# Patient Record
Sex: Female | Born: 1960 | Race: White | Hispanic: No | Marital: Married | Smoking: Never smoker
Health system: Southern US, Community
[De-identification: ages and names within clinical notes are randomized; demographics above are authoritative.]

## PROBLEM LIST (undated history)

## (undated) DIAGNOSIS — T7840XA Allergy, unspecified, initial encounter: Secondary | ICD-10-CM

## (undated) DIAGNOSIS — M549 Dorsalgia, unspecified: Secondary | ICD-10-CM

## (undated) DIAGNOSIS — I10 Essential (primary) hypertension: Secondary | ICD-10-CM

## (undated) DIAGNOSIS — Z8719 Personal history of other diseases of the digestive system: Secondary | ICD-10-CM

## (undated) DIAGNOSIS — Z8619 Personal history of other infectious and parasitic diseases: Secondary | ICD-10-CM

## (undated) DIAGNOSIS — M1990 Unspecified osteoarthritis, unspecified site: Secondary | ICD-10-CM

## (undated) HISTORY — DX: Allergy, unspecified, initial encounter: T78.40XA

## (undated) HISTORY — PX: AUGMENTATION MAMMAPLASTY: SUR837

## (undated) HISTORY — DX: Personal history of other diseases of the digestive system: Z87.19

## (undated) HISTORY — DX: Personal history of other infectious and parasitic diseases: Z86.19

## (undated) HISTORY — DX: Dorsalgia, unspecified: M54.9

## (undated) HISTORY — PX: ABDOMINAL HYSTERECTOMY: SHX81

## (undated) HISTORY — DX: Unspecified osteoarthritis, unspecified site: M19.90

## (undated) HISTORY — DX: Essential (primary) hypertension: I10

---

## 2005-08-21 ENCOUNTER — Other Ambulatory Visit: Admission: RE | Admit: 2005-08-21 | Discharge: 2005-08-21 | Payer: Self-pay | Admitting: Obstetrics & Gynecology

## 2005-10-23 ENCOUNTER — Inpatient Hospital Stay (HOSPITAL_COMMUNITY): Admission: AD | Admit: 2005-10-23 | Discharge: 2005-10-25 | Payer: Self-pay | Admitting: Obstetrics & Gynecology

## 2008-02-13 HISTORY — PX: BREAST ENHANCEMENT SURGERY: SHX7

## 2010-04-20 ENCOUNTER — Other Ambulatory Visit: Payer: Self-pay | Admitting: Obstetrics & Gynecology

## 2010-04-20 DIAGNOSIS — Z1231 Encounter for screening mammogram for malignant neoplasm of breast: Secondary | ICD-10-CM

## 2010-05-03 ENCOUNTER — Ambulatory Visit
Admission: RE | Admit: 2010-05-03 | Discharge: 2010-05-03 | Disposition: A | Discharge status: Home / Self Care | Payer: BC Managed Care – PPO | Source: Ambulatory Visit | Attending: Obstetrics & Gynecology | Admitting: Obstetrics & Gynecology

## 2010-05-03 DIAGNOSIS — Z1231 Encounter for screening mammogram for malignant neoplasm of breast: Secondary | ICD-10-CM

## 2010-05-15 ENCOUNTER — Encounter (INDEPENDENT_AMBULATORY_CARE_PROVIDER_SITE_OTHER): Payer: BC Managed Care – PPO | Admitting: Vascular Surgery

## 2010-05-15 ENCOUNTER — Encounter (INDEPENDENT_AMBULATORY_CARE_PROVIDER_SITE_OTHER): Payer: BC Managed Care – PPO

## 2010-05-15 DIAGNOSIS — I83893 Varicose veins of bilateral lower extremities with other complications: Secondary | ICD-10-CM

## 2010-05-15 DIAGNOSIS — M7989 Other specified soft tissue disorders: Secondary | ICD-10-CM

## 2010-05-16 NOTE — Consult Note (Signed)
NEW PATIENT CONSULTATION  Jacqueline, Beasley H DOB:  [birth year]                                       05/15/2010 CHART#:[id number]  The patient is a healthy 50 year old female referred by Dr. Hyacinth Meeker for edema in both ankles, right worse than left.  She states that over the past 2-3 years she has been noticing some swelling in the ankles and feet, right worse than left.  She started running on a regular basis about this time period.  She has no history of trauma, deep vein thrombosis, thrombophlebitis, varicose veins, stasis ulcers, bleeding or other thrombotic type problems.  She does wear short-leg elastic compression stockings which helps with the swelling.  She denies any specific pain associated with this.  CHRONIC MEDICAL PROBLEMS:  None.  No diabetes, hypertension, hyperlipidemia, coronary artery disease, COPD or stroke.  PAST SURGICAL HISTORY:  Includes cesarean section and hysterectomy.  SOCIAL HISTORY:  She is married, has 2 children, works as an Print production planner.  Does not use tobacco.  Drinks occasional alcohol.  FAMILY HISTORY:  Positive for stroke in her mother, diabetes in a grandmother.  Negative for coronary artery disease.  REVIEW OF SYSTEMS:  Positive for weight gain, dizziness, leg discomfort with walking, occasional dyspnea on exertion, anemia prior to her hysterectomy.  All other review in complete review of systems are negative.  PHYSICAL EXAM:  Vital signs:  Blood pressure 144/89, heart rate 73, respirations 18.  General:  She is well-developed, well-nourished, middle-aged female in no apparent distress, alert and oriented x3. HEENT:  Exam normal for age.  EOMs intact.  Lungs:  Clear to auscultation.  No rhonchi or wheezing.  Cardiovascular:  Reveals regular rhythm.  No murmurs.  Carotid pulses 3+.  No audible bruits.  Abdomen: Soft, nontender with no masses.  Musculoskeletal:  Exam is free of major deformities.  Neurological:  Normal.   Skin:  Free of rashes.  Lower extremity exam reveals no appreciable edema on exam today.  She has 3+ femoral, popliteal and dorsalis pedis pulses palpable.  There is no evidence of any venous insufficiency such as hyperpigmentation, ulceration, varicosities or spider veins.  Today I ordered venous duplex exam which I reviewed and interpreted to rule out any venous problems in the deep or superficial system.  This study was normal.  No evidence of DVT or superficial thrombophlebitis with no reflux in either the deep or superficial system.  I discussed these findings with her and recommended only elevation and short-leg elastic compression stockings.  No further treatment is necessary.  There is no significant pathology identified in her venous system.    Jacqueline Beasley Rochester, M.D. Electronically Signed  JDL/MEDQ  D:  05/15/2010  T:  05/16/2010  Job:  [id number]  cc:   M. Leda Quail, MD

## 2010-05-22 NOTE — Procedures (Unsigned)
DUPLEX DEEP VENOUS EXAM - LOWER EXTREMITY  INDICATION:  Right lower extremity swelling.  HISTORY:  Edema:  Yes. Trauma/Surgery:  No. Pain:  No. PE:  No. Previous DVT:  No. Anticoagulants:  No. Other:  DUPLEX EXAM:               CFV   SFV   PopV  PTV    GSV               R  L  R  L  R  L  R   L  R  L Thrombosis    o  o  o     o     o      o Spontaneous   +  +  +     + Phasic        +  +  +     + Augmentation  +  +  +     +     +      + Compressible  +  +  +     +     +      + Competent     +  +  +     +     +      +  Legend:  + - yes  o - no  p - partial  D - decreased  IMPRESSION:  No evidence of deep venous thrombosis or superficial venous thrombophlebitis or significant reflux in the right lower extremity.   _____________________________ Quita Skye Hart Rochester, M.D.  LT/MEDQ  D:  05/15/2010  T:  05/15/2010  Job:  [id number]

## 2011-04-17 ENCOUNTER — Other Ambulatory Visit: Payer: Self-pay | Admitting: Obstetrics & Gynecology

## 2011-04-17 DIAGNOSIS — Z1231 Encounter for screening mammogram for malignant neoplasm of breast: Secondary | ICD-10-CM

## 2011-05-08 ENCOUNTER — Ambulatory Visit
Admission: RE | Admit: 2011-05-08 | Discharge: 2011-05-08 | Disposition: A | Discharge status: Home / Self Care | Payer: BC Managed Care – PPO | Source: Ambulatory Visit | Attending: Obstetrics & Gynecology | Admitting: Obstetrics & Gynecology

## 2011-05-08 DIAGNOSIS — Z1231 Encounter for screening mammogram for malignant neoplasm of breast: Secondary | ICD-10-CM

## 2012-02-04 ENCOUNTER — Encounter: Payer: Self-pay | Admitting: Internal Medicine

## 2012-02-04 ENCOUNTER — Ambulatory Visit (INDEPENDENT_AMBULATORY_CARE_PROVIDER_SITE_OTHER): Payer: BC Managed Care – PPO | Admitting: Internal Medicine

## 2012-02-04 VITALS — BP 144/90 | HR 68 | Temp 98.1°F | Ht 62.75 in | Wt 172.0 lb

## 2012-02-04 DIAGNOSIS — Z299 Encounter for prophylactic measures, unspecified: Secondary | ICD-10-CM

## 2012-02-04 DIAGNOSIS — H9209 Otalgia, unspecified ear: Secondary | ICD-10-CM

## 2012-02-04 DIAGNOSIS — Z90710 Acquired absence of both cervix and uterus: Secondary | ICD-10-CM

## 2012-02-04 DIAGNOSIS — E8940 Asymptomatic postprocedural ovarian failure: Secondary | ICD-10-CM

## 2012-02-04 DIAGNOSIS — R030 Elevated blood-pressure reading, without diagnosis of hypertension: Secondary | ICD-10-CM

## 2012-02-04 DIAGNOSIS — E8941 Symptomatic postprocedural ovarian failure: Secondary | ICD-10-CM

## 2012-02-04 LAB — LIPID PANEL
HDL: 64.9 mg/dL (ref >39.00)
VLDL: 22 mg/dL (ref 0.0–40.0)

## 2012-02-04 LAB — BASIC METABOLIC PANEL
BUN: 7 mg/dL (ref 6–23)
Chloride: 102 mEq/L (ref 96–112)
Glucose, Bld: 94 mg/dL (ref 70–99)
Potassium: 4.2 mEq/L (ref 3.5–5.1)

## 2012-02-04 LAB — CBC WITH DIFFERENTIAL/PLATELET
Basophils Absolute: 0 10*3/uL (ref 0.0–0.1)
Basophils Relative: 0.6 % (ref 0.0–3.0)
Eosinophils Absolute: 0.1 10*3/uL (ref 0.0–0.7)
Lymphocytes Relative: 28.2 % (ref 12.0–46.0)
MCHC: 33.6 g/dL (ref 30.0–36.0)
Monocytes Relative: 6.1 % (ref 3.0–12.0)
Neutrophils Relative %: 64 % (ref 43.0–77.0)
RBC: 4.21 Mil/uL (ref 3.87–5.11)
RDW: 13.3 % (ref 11.5–14.6)

## 2012-02-04 LAB — SEDIMENTATION RATE: Sed Rate: 6 mm/hr (ref 0–22)

## 2012-02-04 LAB — TSH: TSH: 1.25 u[IU]/mL (ref 0.35–5.50)

## 2012-02-04 LAB — HEPATIC FUNCTION PANEL
AST: 22 U/L (ref 0–37)
Alkaline Phosphatase: 54 U/L (ref 39–117)
Bilirubin, Direct: 0 mg/dL (ref 0.0–0.3)

## 2012-02-04 NOTE — Patient Instructions (Signed)
There are many causes of ear pain uncertain which one you have. It is possible you have eustachian tube dysfunction or sinus disease. TMJ can also cause pain but is a little atypical in your situation Your eardrum looks normal to me today. Can use Aleve as needed for now we'll arrange an ear nose and throat consultation ;someone will contact you for this.   Her blood pressure is elevated today you have a family history of high blood pressure. It is possible that this is just elevated because of the visit and recent use of decongestant and some pain.  Check blood pressure readings out of the office at least 5 times a week.  We'll plan to have you come back in one to 2 months at that time bring her monitor and  readings and we will decide if we need to do further intervention. Contact us if your readings are at or above 160  would begin medication.   Lifestyle intervention is also helpful for blood pressure control. Continue your exercise dietary intervention  DASH.     DASH Diet The DASH diet stands for "Dietary Approaches to Stop Hypertension." It is a healthy eating plan that has been shown to reduce high blood pressure (hypertension) in as little as 14 days, while also possibly providing other significant health benefits. These other health benefits include reducing the risk of breast cancer after menopause and reducing the risk of type 2 diabetes, heart disease, colon cancer, and stroke. Health benefits also include weight loss and slowing kidney failure in patients with chronic kidney disease.  DIET GUIDELINES  Limit salt (sodium). Your diet should contain less than 1500 mg of sodium daily.  Limit refined or processed carbohydrates. Your diet should include mostly whole grains. Desserts and added sugars should be used sparingly.  Include small amounts of heart-healthy fats. These types of fats include nuts, oils, and tub margarine. Limit saturated and trans fats. These fats have been shown to  be harmful in the body. CHOOSING FOODS  The following food groups are based on a 2000 calorie diet. See your Registered Dietitian for individual calorie needs. Grains and Grain Products (6 to 8 servings daily)  Eat More Often: Whole-wheat bread, brown rice, whole-grain or wheat pasta, quinoa, popcorn without added fat or salt (air popped).  Eat Less Often: White bread, white pasta, white rice, cornbread. Vegetables (4 to 5 servings daily)  Eat More Often: Fresh, frozen, and canned vegetables. Vegetables may be raw, steamed, roasted, or grilled with a minimal amount of fat.  Eat Less Often/Avoid: Creamed or fried vegetables. Vegetables in a cheese sauce. Fruit (4 to 5 servings daily)  Eat More Often: All fresh, canned (in natural juice), or frozen fruits. Dried fruits without added sugar. One hundred percent fruit juice ( cup [237 mL] daily).  Eat Less Often: Dried fruits with added sugar. Canned fruit in light or heavy syrup. [employer], Fish, and Poultry (2 servings or less daily. One serving is 3 to 4 oz [85-114 g]).  Eat More Often: Ninety percent or leaner ground beef, tenderloin, sirloin. Round cuts of beef, chicken breast, [place] breast. All fish. Grill, bake, or broil your meat. Nothing should be fried.  Eat Less Often/Avoid: Fatty cuts of meat, [place], or chicken leg, thigh, or wing. Fried cuts of meat or fish. Dairy (2 to 3 servings)  Eat More Often: Low-fat or fat-free milk, low-fat plain or light yogurt, reduced-fat or part-skim cheese.  Eat Less Often/Avoid: Milk (whole, 2%).Whole milk  yogurt. Full-fat cheeses. Nuts, Seeds, and Legumes (4 to 5 servings per week)  Eat More Often: All without added salt.  Eat Less Often/Avoid: Salted nuts and seeds, canned beans with added salt. Fats and Sweets (limited)  Eat More Often: Vegetable oils, tub margarines without trans fats, sugar-free gelatin. Mayonnaise and salad dressings.  Eat Less Often/Avoid: Coconut oils, palm  oils, butter, stick margarine, cream, half and half, cookies, candy, pie. FOR MORE INFORMATION The Dash Diet Eating Plan: www.dashdiet.org Document Released: 01/18/2011 Document Revised: 04/23/2011 Document Reviewed: 01/18/2011 [facility] Patient Information 2013 [place], [place]. How to Take Your Blood Pressure  These instructions are only for electronic home blood pressure machines. You will need:   An automatic or semi-automatic blood pressure machine.  Fresh batteries for the blood pressure machine. HOW DO I USE THESE TOOLS TO CHECK MY BLOOD PRESSURE?   There are 2 numbers that make up your blood pressure. For example: 120/80.  The first number (120 in our example) is called the "systolic pressure." It is a measure of the pressure in your blood vessels when your heart is pumping blood.  The second number (80 in our example) is called the "diastolic pressure." It is a measure of the pressure in your blood vessels when your heart is resting between beats.  Before you buy a home blood pressure machine, check the size of your arm so you can buy the right size cuff. Here is how to check the size of your arm:  Use a tape measure that shows both inches and centimeters.  Wrap the tape measure around the middle upper part of your arm. You may need someone to help you measure right.  Write down your arm measurement in both inches and centimeters.  To measure your blood pressure right, it is important to have the right size cuff.  If your arm is up to 13 inches (37 to 34 centimeters), get an adult cuff size.  If your arm is 13 to 17 inches (35 to 44 centimeters), get a large adult cuff size.  If your arm is 17 to 20 inches (45 to 52 centimeters), get an adult thigh cuff.  Try to rest or relax for at least 30 minutes before you check your blood pressure.  Do not smoke.  Do not have any drinks with caffeine, such as:  Pop.  Coffee.  Tea.  Check your blood pressure in a quiet  room.  Sit down and stretch out your arm on a table. Keep your arm at about the level of your heart. Let your arm relax. GETTING BLOOD PRESSURE READINGS  Make sure you remove any tight-fighting clothing from your arm. Wrap the cuff around your upper arm. Wrap it just above the bend, and above where you felt the pulse. You should be able to slip a finger between the cuff and your arm. If you cannot slip a finger in the cuff, it is too tight and should be removed and rewrapped.  Some units requires you to manually pump up the arm cuff.  Automatic units inflate the cuff when you press a button.  Cuff deflation is automatic in both models.  After the cuff is inflated, the unit measures your blood pressure and pulse. The readings are displayed on a monitor. Hold still and breathe normally while the cuff is inflated.  Getting a reading takes less than a minute.  Some models store readings in a memory. Some provide a printout of readings.  Get readings at different  times of the day. You should wait at least 5 minutes between readings. Take readings with you to your next doctor's visit. Document Released: 01/12/2008 Document Revised: 04/23/2011 Document Reviewed: 01/12/2008 [facility] Patient Information 2013 [place], [place].

## 2012-02-04 NOTE — Progress Notes (Signed)
Patient comes in as new patient visit . Previous care was   Chief Complaint  Patient presents with  . Establish Care    HPI:  Patient comes in as new patient visit . Previous care was  Through her gyne Dr Hyacinth Meeker but she has had a total hysterectomy and establishing with pcp as advised .  She has been battling a problem with ear pain since March of this year. She's been seen in urgent care number of times. Has been treated with 2 Z-Paks and then Augmentin Zyrtec is also given steroids at some point for swollen glands pain usually on the right side. Doesn't really feel congested but might be a bit stuffy. Is taking Flonase as recommended. Antibiotics seem to tamp down the pain but never goes away for a long time. She'll take decongestants with help but thinks it might elevate her blood pressure readings t took sudafed this weekend because of pain. She describes at this time as the right sided pain somewhat pressure perhaps some right face pain jaw pain down into the neck at times no dysphasia has a remote history of TMJ but not tender to touch.  No rash . Not getting home  BP readings  Usually ok if not on sudafed but does have a family history of hypertension mother ocass hear beating high like a panic.  Beating in throat   Hit or miss.  Is able to run and exercise without significant chest pain shortness of breath. Sleep  5 hours .  Usually. His voice had an issue sleeping no change. ocass jaw popping.   Has had braces .    When younger. ROS:  GEN/ HEENT: No fever, significant weight changes sweats headaches vision problems hearing changes, CV/ PULM; No chest pain  cough, syncope,change in exercise tolerance.wears compression stockings when up a lot  GI /GU: No adominal pain, vomiting, change in bowel habits. No blood in the stool. No significant GU symptoms. SKIN/HEME: ,no acute skin rashes suspicious lesions or bleeding. No lymphadenopathy, nodules, masses.  NEURO/ PSYCH:  No neurologic signs  such as weakness numbness. No depression anxiety. IMM/ Allergy: No unusual infections.  Allergy .   REST of 12 system review negative except as per HPI   Past Medical History  Diagnosis Date  . Hx of varicella     Family History  Problem Relation Age of Onset  . Cancer Mother     Uterus  . Stroke Mother     died 65   . Hypertension Mother   . Diabetes Maternal Grandmother     History   Social History  . Marital Status: Married    Spouse Name: N/A    Number of Children: N/A  . Years of Education: N/A   Social History Main Topics  . Smoking status: Never Smoker   . Smokeless tobacco: Not on file  . Alcohol Use: Yes  . Drug Use: Not on file  . Sexually Active: Not on file   Other Topics Concern  . Not on file   Social History Narrative   hh of 2-3  Son in college daughter ru grad not at home   2 dogs  No ets 1-2 per day  etohExercise 5 days per week. 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 estradiol (CLIMARA - DOSED IN MG/24 HR) 0.1 mg/24hr Place  1 patch onto the skin once a week.      . fluticasone (VERAMYST) 27.5 MCG/SPRAY nasal spray         EXAM:  BP 144/90  Pulse 68  Temp 98.1 F (36.7 C) (Oral)  Ht 5' 2.75" (1.594 m)  Wt 172 lb (78.019 kg)  BMI 30.71 kg/m2  SpO2 99%  Body mass index is 30.71 kg/(m^2). Bp right  150/92 left 144/94  GENERAL: vitals reviewed and listed above, alert, oriented, appears well hydrated and in no acute distress HEENT: Normocephalic ;atraumatic , Eyes;  PERRL, EOMs  Full, lids and conjunctiva clear,,Ears: no deformities, canals nl, TM landmarks normal, Nose: no deformity or discharge  Mild congestion > tender right maxilla  Area of right jaw line but no masses or adenopathy  Mouth : OP clear without lesion or edema . NECK: no obvious masses on inspection palpation   No tmj pain or click  LUNGS: clear to auscultation bilaterally, no wheezes, rales or rhonchi, good air movement CV: HRRR, no clubbing cyanosis or  peripheral edema nl cap refill  Abdomen:  Sof,t normal bowel sounds without hepatosplenomegaly, no guarding rebound or masses no CVA tenderness MS: moves all extremities without noticeable focal  abnormality PSYCH: pleasant and cooperative, no obvious depression or anxiety  ASSESSMENT AND PLAN:  Discussed the following assessment and plan:  1. Otalgia  HM MAMMOGRAPHY, HM PAP SMEAR, fluticasone (VERAMYST) 27.5 MCG/SPRAY nasal spray, estradiol (CLIMARA - DOSED IN MG/24 HR) 0.1 mg/24hr, Basic metabolic panel, CBC with Differential, Hepatic function panel, Lipid panel, TSH, Sedimentation rate, Ambulatory referral to ENT   uncertain cause  hx of rx with antibiotic and steroids ? referred sinus vs jaw? eustachian tube?  2. Elevated blood pressure reading  HM MAMMOGRAPHY, HM PAP SMEAR, fluticasone (VERAMYST) 27.5 MCG/SPRAY nasal spray, estradiol (CLIMARA - DOSED IN MG/24 HR) 0.1 mg/24hr, Basic metabolic panel, CBC with Differential, Hepatic function panel, Lipid panel, TSH, Sedimentation rate   fam hx of ht and mom with cva at 68  close follow up and rx as  indicated   3. Preventive measure  HM MAMMOGRAPHY, HM PAP SMEAR, fluticasone (VERAMYST) 27.5 MCG/SPRAY nasal spray, estradiol (CLIMARA - DOSED IN MG/24 HR) 0.1 mg/24hr, Basic metabolic panel, CBC with Differential, Hepatic function panel, Lipid panel, TSH, Sedimentation rate  4. 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  Call for refill climara when due.  Declined flu vaccine ;last td 2007  mammo 2013  -Patient advised to return or notify health care team  immediately if symptoms worsen or persist or new concerns arise.  Patient Instructions  There are many causes of ear pain uncertain which one you have. It is possible you have eustachian tube  dysfunction or sinus disease. TMJ can also cause pain but is a little atypical in your situation Your eardrum looks normal to me today. Can use Aleve as needed for now we'll arrange an ear nose and throat consultation ;someone will contact you for this.   Her blood pressure is elevated today you have a family history of high blood pressure. It is possible that this is just elevated because of the visit and recent use of decongestant and some pain.  Check blood pressure readings out of the office at least 5 times a week.  We'll plan to have you come back in one to 2 months at that time bring her monitor and  readings and  we will decide if we need to do further intervention. Contact us if your readings are at or above 160  would begin medication.   Lifestyle intervention is also helpful for blood pressure control. Continue your exercise dietary intervention  DASH.     DASH Diet The DASH diet stands for "Dietary Approaches to Stop Hypertension." It is a healthy eating plan that has been shown to reduce high blood pressure (hypertension) in as little as 14 days, while also possibly providing other significant health benefits. These other health benefits include reducing the risk of breast cancer after menopause and reducing the risk of type 2 diabetes, heart disease, colon cancer, and stroke. Health benefits also include weight loss and slowing kidney failure in patients with chronic kidney disease.  DIET GUIDELINES  Limit salt (sodium). Your diet should contain less than 1500 mg of sodium daily.  Limit refined or processed carbohydrates. Your diet should include mostly whole grains. Desserts and added sugars should be used sparingly.  Include small amounts of heart-healthy fats. These types of fats include nuts, oils, and tub margarine. Limit saturated and trans fats. These fats have been shown to be harmful in the body. CHOOSING FOODS  The following food groups are based on a 2000 calorie diet.  See your Registered Dietitian for individual calorie needs. Grains and Grain Products (6 to 8 servings daily)  Eat More Often: Whole-wheat bread, brown rice, whole-grain or wheat pasta, quinoa, popcorn without added fat or salt (air popped).  Eat Less Often: White bread, white pasta, white rice, cornbread. Vegetables (4 to 5 servings daily)  Eat More Often: Fresh, frozen, and canned vegetables. Vegetables may be raw, steamed, roasted, or grilled with a minimal amount of fat.  Eat Less Often/Avoid: Creamed or fried vegetables. Vegetables in a cheese sauce. Fruit (4 to 5 servings daily)  Eat More Often: All fresh, canned (in natural juice), or frozen fruits. Dried fruits without added sugar. One hundred percent fruit juice ( cup [237 mL] daily).  Eat Less Often: Dried fruits with added sugar. Canned fruit in light or heavy syrup. [employer], Fish, and Poultry (2 servings or less daily. One serving is 3 to 4 oz [85-114 g]).  Eat More Often: Ninety percent or leaner ground beef, tenderloin, sirloin. Round cuts of beef, chicken breast, [place] breast. All fish. Grill, bake, or broil your meat. Nothing should be fried.  Eat Less Often/Avoid: Fatty cuts of meat, [place], or chicken leg, thigh, or wing. Fried cuts of meat or fish. Dairy (2 to 3 servings)  Eat More Often: Low-fat or fat-free milk, low-fat plain or light yogurt, reduced-fat or part-skim cheese.  Eat Less Often/Avoid: Milk (whole, 2%).Whole milk yogurt. Full-fat cheeses. Nuts, Seeds, and Legumes (4 to 5 servings per week)  Eat More Often: All without added salt.  Eat Less Often/Avoid: Salted nuts and seeds, canned beans with added salt. Fats and Sweets (limited)  Eat More Often: Vegetable oils, tub margarines without trans fats, sugar-free gelatin. Mayonnaise and salad dressings.  Eat Less Often/Avoid: Coconut oils, palm oils, butter, stick margarine, cream, half and half, cookies, candy, pie. FOR MORE INFORMATION The Dash  Diet Eating Plan: www.dashdiet.org Document Released: 01/18/2011 Document Revised: 04/23/2011 Document Reviewed: 01/18/2011 [facility] Patient Information 2013 [place], [place]. How to Take Your Blood Pressure  These instructions are only for electronic home blood pressure machines. You will need:   An automatic or semi-automatic blood pressure machine.  Fresh batteries for the blood pressure machine. HOW DO I USE  THESE TOOLS TO CHECK MY BLOOD PRESSURE?   There are 2 numbers that make up your blood pressure. For example: 120/80.  The first number (120 in our example) is called the "systolic pressure." It is a measure of the pressure in your blood vessels when your heart is pumping blood.  The second number (80 in our example) is called the "diastolic pressure." It is a measure of the pressure in your blood vessels when your heart is resting between beats.  Before you buy a home blood pressure machine, check the size of your arm so you can buy the right size cuff. Here is how to check the size of your arm:  Use a tape measure that shows both inches and centimeters.  Wrap the tape measure around the middle upper part of your arm. You may need someone to help you measure right.  Write down your arm measurement in both inches and centimeters.  To measure your blood pressure right, it is important to have the right size cuff.  If your arm is up to 13 inches (37 to 34 centimeters), get an adult cuff size.  If your arm is 13 to 17 inches (35 to 44 centimeters), get a large adult cuff size.  If your arm is 17 to 20 inches (45 to 52 centimeters), get an adult thigh cuff.  Try to rest or relax for at least 30 minutes before you check your blood pressure.  Do not smoke.  Do not have any drinks with caffeine, such as:  Pop.  Coffee.  Tea.  Check your blood pressure in a quiet room.  Sit down and stretch out your arm on a table. Keep your arm at about the level of your heart. Let your  arm relax. GETTING BLOOD PRESSURE READINGS  Make sure you remove any tight-fighting clothing from your arm. Wrap the cuff around your upper arm. Wrap it just above the bend, and above where you felt the pulse. You should be able to slip a finger between the cuff and your arm. If you cannot slip a finger in the cuff, it is too tight and should be removed and rewrapped.  Some units requires you to manually pump up the arm cuff.  Automatic units inflate the cuff when you press a button.  Cuff deflation is automatic in both models.  After the cuff is inflated, the unit measures your blood pressure and pulse. The readings are displayed on a monitor. Hold still and breathe normally while the cuff is inflated.  Getting a reading takes less than a minute.  Some models store readings in a memory. Some provide a printout of readings.  Get readings at different times of the day. You should wait at least 5 minutes between readings. Take readings with you to your next doctor's visit. Document Released: 01/12/2008 Document Revised: 04/23/2011 Document Reviewed: 01/12/2008 [facility] Patient Information 2013 [place], [place].      Neta Mends. Kamden Stanislaw M.D.

## 2012-04-30 ENCOUNTER — Other Ambulatory Visit: Payer: Self-pay

## 2012-04-30 DIAGNOSIS — Z1231 Encounter for screening mammogram for malignant neoplasm of breast: Secondary | ICD-10-CM

## 2012-05-29 ENCOUNTER — Ambulatory Visit: Payer: Self-pay | Admitting: Obstetrics & Gynecology

## 2012-06-05 ENCOUNTER — Ambulatory Visit
Admission: RE | Admit: 2012-06-05 | Discharge: 2012-06-05 | Disposition: A | Discharge status: Home / Self Care | Payer: BC Managed Care – PPO | Source: Ambulatory Visit

## 2012-06-05 DIAGNOSIS — Z1231 Encounter for screening mammogram for malignant neoplasm of breast: Secondary | ICD-10-CM

## 2012-06-11 ENCOUNTER — Other Ambulatory Visit: Payer: Self-pay | Admitting: Internal Medicine

## 2012-06-23 ENCOUNTER — Encounter: Payer: BC Managed Care – PPO | Admitting: Internal Medicine

## 2012-06-23 ENCOUNTER — Encounter: Payer: Self-pay | Admitting: Internal Medicine

## 2012-06-23 DIAGNOSIS — Z0289 Encounter for other administrative examinations: Secondary | ICD-10-CM

## 2012-06-23 NOTE — Progress Notes (Signed)
Record opened for appt review  Pt didn't come

## 2012-07-08 ENCOUNTER — Other Ambulatory Visit: Payer: Self-pay | Admitting: Internal Medicine

## 2012-07-09 ENCOUNTER — Other Ambulatory Visit: Payer: Self-pay

## 2012-08-21 ENCOUNTER — Telehealth: Payer: Self-pay | Admitting: Internal Medicine

## 2012-08-21 ENCOUNTER — Encounter: Payer: Self-pay | Admitting: Family Medicine

## 2012-08-21 ENCOUNTER — Ambulatory Visit (INDEPENDENT_AMBULATORY_CARE_PROVIDER_SITE_OTHER): Payer: BC Managed Care – PPO | Admitting: Family Medicine

## 2012-08-21 ENCOUNTER — Ambulatory Visit (INDEPENDENT_AMBULATORY_CARE_PROVIDER_SITE_OTHER)
Admission: RE | Admit: 2012-08-21 | Discharge: 2012-08-21 | Disposition: A | Discharge status: Home / Self Care | Payer: BC Managed Care – PPO | Source: Ambulatory Visit | Attending: Family Medicine | Admitting: Family Medicine

## 2012-08-21 VITALS — BP 136/80 | HR 74 | Temp 97.9°F | Ht 62.0 in | Wt 167.0 lb

## 2012-08-21 DIAGNOSIS — M542 Cervicalgia: Secondary | ICD-10-CM

## 2012-08-21 DIAGNOSIS — M546 Pain in thoracic spine: Secondary | ICD-10-CM

## 2012-08-21 NOTE — Progress Notes (Addendum)
  Subjective:    Patient ID: Jacqueline Beasley, female    DOB: January 16, 1961, 52 y.o.   MRN: [id number]  HPI Patient seen with multiple musculoskeletal complaints. She relates about 3-4 month history of right mid thoracic back pain. She is seeing chiropractor and reportedly has had previous thoracic x-rays and we do not have those results. She describes a sharp to dull occasional burning pain which radiates anterior right side. No abdominal pain. No rash. Has taken Aleve without relief. No alleviating factors. Denies any dyspnea, cough, fever, chills, pleuritic pain, or any appetite or weight changes. No improvement with chiropractic care for several months now. Symptoms are progressing in severity.  Patient also relates right cervical neck pain for over one year Those symptoms are also progressive. Occasional sharp pains right upper extremity. No significant numbness or weakness. No alleviating factors. Previous lab work unremarkable including recent sedimentation rate of 6. Denies history of any recent injury  Past Medical History  Diagnosis Date  . Hx of varicella    Past Surgical History  Procedure Laterality Date  . Abdominal hysterectomy      total with bso  for fibroids.   . Cesarean section  [id number]  . Breast enhancement surgery  2010    reports that she has never smoked. She does not have any smokeless tobacco history on file. She reports that  drinks alcohol. Her drug history is not on file. family history includes Cancer in her mother; Diabetes in her maternal grandmother; Hypertension in her mother; and Stroke in her mother. No Known Allergies    Review of Systems  Constitutional: Negative for fever, chills, appetite change and unexpected weight change.  HENT: Positive for neck pain.   Respiratory: Negative for cough and shortness of breath.   Cardiovascular: Negative for chest pain and leg swelling.  Gastrointestinal: Negative for nausea, vomiting and abdominal pain.   Genitourinary: Negative for dysuria.  Musculoskeletal: Positive for back pain.  Skin: Negative for rash.  Neurological: Negative for dizziness.  Hematological: Negative for adenopathy.       Objective:   Physical Exam  Constitutional: She appears well-developed and well-nourished.  Neck: Neck supple. No thyromegaly present.  Cardiovascular: Normal rate and regular rhythm.   Pulmonary/Chest: Effort normal and breath sounds normal. No respiratory distress. She has no wheezes. She has no rales.  Musculoskeletal: She exhibits no edema.  No reproducible thoracic tenderness She has pain with flexion and extension of neck but no spinal tenderness.  Neurological:  Full-strength upper extremities. Symmetric upper extremity reflexes.  Skin: No rash noted.          Assessment & Plan:  #1 right-sided thoracic back pain of several months duration not responding to conservative chiropractic treatment. Obtain plain films thoracic spine. Question neuropathic pain. Consider MRI or further assess #2 chronic right cervical neck pain with probable right-sided radiculopathy symptoms. Plain film cervical spine. May need cervical MRI to further assess.  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  Her symptoms have been progressive for over one year and not relieved with conservative therapy.  Will set up Cervical MRI to further assess. Pt notified and agrees with plan.  Cervical MRI reveals arthritic changes especially C5-C6 and C6-C7 with some foraminal encroachment on the right which corresponds with her side of involvement. She's had previous chiropractic care without much improvement. Set up neurosurgical referral and she agrees with this plan

## 2012-08-21 NOTE — Telephone Encounter (Signed)
Patient Information:  Caller Name: Salvador  Phone: [phone]  Patient: Jacqueline, Beasley  Gender: Female  DOB: 20-Apr-1960  Age: 52 Years  PCP: Berniece Andreas [facility])  Pregnant: No  Office Follow Up:  Does the office need to follow up with this patient?: No  Instructions For The Office: N/A   Symptoms  Reason For Call & Symptoms: Reports neck and ear pain, center of back. Has seen a doctor at [facility]. Reports that she had two falls onto her right side back in January 2014.  Reviewed Health History In EMR: Yes  Reviewed Medications In EMR: Yes  Reviewed Allergies In EMR: Yes  Reviewed Surgeries / Procedures: Yes  Date of Onset of Symptoms: 08/07/2012  Treatments Tried: Aleve  Treatments Tried Worked: Yes OB / GYN:  LMP: Unknown  Guideline(s) Used:  Back Pain  Neck Pain or Stiffness  Disposition Per Guideline:   See Today in Office  Reason For Disposition Reached:   Patient wants to be seen  Advice Given:  N/A  Patient Will Follow Care Advice:  YES  Appointment Scheduled:  08/21/2012 16:30:00 Appointment Scheduled Provider:  Evelena Peat (Family Practice)

## 2012-08-22 ENCOUNTER — Telehealth: Payer: Self-pay

## 2012-08-22 NOTE — Telephone Encounter (Signed)
Pt called and left message, stating that she missed your call last night or this morning about her xray results. Pt stated that you can call her at work [phone] or on her cell [phone]

## 2012-08-26 NOTE — Telephone Encounter (Signed)
Pt states she is supposed to have an MRI set up. If we call today or tomorrow,  pls call work @ [phone].  After that, pt should have her cell back.

## 2012-08-26 NOTE — Addendum Note (Signed)
Addended by: Kristian Covey on: 08/26/2012 04:03 PM   Modules accepted: Orders

## 2012-08-27 NOTE — Telephone Encounter (Signed)
Please see note regarding phone number

## 2012-09-02 ENCOUNTER — Telehealth: Payer: Self-pay | Admitting: Family Medicine

## 2012-09-02 ENCOUNTER — Ambulatory Visit
Admission: RE | Admit: 2012-09-02 | Discharge: 2012-09-02 | Disposition: A | Discharge status: Home / Self Care | Payer: BC Managed Care – PPO | Source: Ambulatory Visit | Attending: Family Medicine | Admitting: Family Medicine

## 2012-09-02 ENCOUNTER — Other Ambulatory Visit: Payer: Self-pay | Admitting: Internal Medicine

## 2012-09-02 DIAGNOSIS — M542 Cervicalgia: Secondary | ICD-10-CM

## 2012-09-02 NOTE — Telephone Encounter (Signed)
This patient needs a preventive visit in Nov per [facility].  Please call the patient and make this appointment.  Climara patch filled until Nov.  Thanks!!

## 2012-09-04 NOTE — Addendum Note (Signed)
Addended by: Kristian Covey on: 09/04/2012 08:29 AM   Modules accepted: Orders

## 2012-12-23 ENCOUNTER — Other Ambulatory Visit (INDEPENDENT_AMBULATORY_CARE_PROVIDER_SITE_OTHER): Payer: BC Managed Care – PPO

## 2012-12-23 DIAGNOSIS — Z0000 Encounter for general adult medical examination without abnormal findings: Secondary | ICD-10-CM

## 2012-12-23 LAB — TSH: TSH: 2.39 u[IU]/mL (ref 0.35–5.50)

## 2012-12-23 LAB — HEPATIC FUNCTION PANEL
Alkaline Phosphatase: 53 U/L (ref 39–117)
Bilirubin, Direct: 0 mg/dL (ref 0.0–0.3)
Total Bilirubin: 0.7 mg/dL (ref 0.3–1.2)
Total Protein: 7 g/dL (ref 6.0–8.3)

## 2012-12-23 LAB — BASIC METABOLIC PANEL
BUN: 10 mg/dL (ref 6–23)
CO2: 27 mEq/L (ref 19–32)
Chloride: 101 mEq/L (ref 96–112)
Creatinine, Ser: 0.6 mg/dL (ref 0.4–1.2)
Glucose, Bld: 109 mg/dL — ABNORMAL HIGH (ref 70–99)
Potassium: 4.7 mEq/L (ref 3.5–5.1)

## 2012-12-23 LAB — CBC WITH DIFFERENTIAL/PLATELET
Basophils Relative: 1 % (ref 0.0–3.0)
Eosinophils Absolute: 0.1 10*3/uL (ref 0.0–0.7)
Eosinophils Relative: 2.4 % (ref 0.0–5.0)
Hemoglobin: 13.5 g/dL (ref 12.0–15.0)
Lymphocytes Relative: 37.6 % (ref 12.0–46.0)
MCHC: 34.3 g/dL (ref 30.0–36.0)
Neutro Abs: 2.5 10*3/uL (ref 1.4–7.7)
Neutrophils Relative %: 51.6 % (ref 43.0–77.0)
RBC: 4.46 Mil/uL (ref 3.87–5.11)
WBC: 4.9 10*3/uL (ref 4.5–10.5)

## 2012-12-23 LAB — LIPID PANEL
Cholesterol: 246 mg/dL — ABNORMAL HIGH (ref 0–200)
Total CHOL/HDL Ratio: 4
VLDL: 29 mg/dL (ref 0.0–40.0)

## 2012-12-30 ENCOUNTER — Ambulatory Visit (INDEPENDENT_AMBULATORY_CARE_PROVIDER_SITE_OTHER): Payer: BC Managed Care – PPO | Admitting: Internal Medicine

## 2012-12-30 ENCOUNTER — Encounter: Payer: Self-pay | Admitting: Internal Medicine

## 2012-12-30 VITALS — BP 150/94 | HR 68 | Temp 98.0°F | Ht 62.5 in | Wt 170.0 lb

## 2012-12-30 DIAGNOSIS — Z0000 Encounter for general adult medical examination without abnormal findings: Secondary | ICD-10-CM

## 2012-12-30 DIAGNOSIS — Z833 Family history of diabetes mellitus: Secondary | ICD-10-CM | POA: Insufficient documentation

## 2012-12-30 DIAGNOSIS — I10 Essential (primary) hypertension: Secondary | ICD-10-CM

## 2012-12-30 DIAGNOSIS — R7301 Impaired fasting glucose: Secondary | ICD-10-CM

## 2012-12-30 DIAGNOSIS — E785 Hyperlipidemia, unspecified: Secondary | ICD-10-CM

## 2012-12-30 DIAGNOSIS — Z1211 Encounter for screening for malignant neoplasm of colon: Secondary | ICD-10-CM

## 2012-12-30 MED ORDER — LISINOPRIL 10 MG PO TABS: 10.0000 mg | ORAL_TABLET | Freq: Every day | ORAL | Status: DC

## 2012-12-30 NOTE — Progress Notes (Signed)
Chief Complaint  Patient presents with  . Annual Exam    HPI: Patient comes in today for Preventive Health Care visit  Since her last visit she hasn't been as conscientious in regard to exercise and diet her back problem has limited her [place]. She's gained back some of the weight that she had lost. Otherwise feels pretty healthy. She states her blood pressure had been pretty good when she had lost the weight.  There is a family history of diabetes negative for colon cancer first degree relative. ROS:  GEN/ HEENT: No fever, significant weight changes sweats headaches vision problems hearing changes, CV/ PULM; No chest pain shortness of breath cough, syncope,edema  change in exercise tolerance. GI /GU: No adominal pain, vomiting, change in bowel habits. No blood in the stool. Has had a history of fissures but no bleeding. No significant GU symptoms. SKIN/HEME: ,no acute skin rashes suspicious lesions or bleeding. No lymphadenopathy, nodules, masses.  NEURO/ PSYCH:  No neurologic signs such as weakness numbness. No depression anxiety. IMM/ Allergy: No unusual infections.  Allergy .   REST of 12 system review negative except as per HPI   Past Medical History  Diagnosis Date  . Hx of varicella   . Back pain     Recurrent has seen neurosurgery in past    Family History  Problem Relation Age of Onset  . Cancer Mother     Uterus  . Stroke Mother     died 29   . Hypertension Mother   . Diabetes Maternal Grandmother    Past Surgical History  Procedure Laterality Date  . Abdominal hysterectomy      total with bso  for fibroids.   . Cesarean section  [id number]  . Breast enhancement surgery  2010     History   Social History  . Marital Status: Married    Spouse Name: N/A    Number of Children: N/A  . Years of Education: N/A   Social History Main Topics  . Smoking status: Never Smoker   . Smokeless tobacco: None  . Alcohol Use: Yes  . Drug Use: None  . Sexual Activity:  None   Other Topics Concern  . None   Social History Narrative   hh of 2-3  Son in college daughter ru grad not at home      2 dogs  No ets    1-2 per day  etoh   Exercise 5 days per week.    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 Cetirizine HCl (ZYRTEC ALLERGY) 10 MG CAPS Take 10 mg by mouth as needed.  Marland Kitchen CLIMARA 0.1 MG/24HR Place 1 patch (0.1 mg total) onto the skin once a week.  . diphenhydrAMINE (BENADRYL) 25 MG tablet Take 25 mg by mouth every 6 (six) hours as needed.  Marland Kitchen lisinopril (PRINIVIL,ZESTRIL) 10 MG tablet Take 1 tablet (10 mg total) by mouth daily.  . [DISCONTINUED] fluticasone (VERAMYST) 27.5 MCG/SPRAY nasal spray     EXAM:  BP 150/94  Pulse 68  Temp(Src) 98 F (36.7 C) (Oral)  Ht 5' 2.5" (1.588 m)  Wt 170 lb (77.111 kg)  BMI 30.58 kg/m2  SpO2 98%  Body mass index is 30.58 kg/(m^2). Repeat blood pressures 146/94 left 148/96 right. Physical Exam: Vital signs reviewed [id number] is a  well-developed well-nourished alert cooperative   female who appears her stated age in no acute distress.  HEENT: normocephalic atraumatic , Eyes: PERRL EOM's full, conjunctiva clear, Nares: paten,t no deformity discharge or tenderness., Ears: no deformity EAC's clear TMs with normal landmarks. Mouth: clear OP, no lesions, edema.  Moist mucous membranes. Dentition in adequate repair. NECK: supple without masses, thyromegaly or bruits. CHEST/PULM:  Clear to auscultation and percussion breath sounds equal no wheeze , rales or rhonchi. No chest wall deformities or tenderness. Breast: normal by inspection . No dimpling, discharge, masses, tenderness or discharge . Implants in place CV: PMI is nondisplaced, S1 S2 no gallops, murmurs, rubs. Peripheral pulses are full without delay.No JVD .  ABDOMEN: Bowel sounds normal nontender   No guard or rebound, no hepato splenomegal no CVA tenderness.  No hernia. Extremtities:  No clubbing cyanosis or edema, no acute joint swelling or redness no focal atrophy NEURO:  Oriented x3, cranial nerves 3-12 appear to be intact, no obvious focal weakness,gait within normal limits no abnormal reflexes or asymmetrical SKIN: No acute rashes normal turgor, color, no bruising or petechiae. PSYCH: Oriented, good eye contact, no obvious depression anxiety, cognition and judgment appear normal. LN: no cervical axillary inguinal adenopathy  Lab Results  Component Value Date   WBC 4.9 12/23/2012   HGB 13.5 12/23/2012   HCT 39.2 12/23/2012   PLT 323.0 12/23/2012   GLUCOSE 109* 12/23/2012   CHOL 246* 12/23/2012   TRIG 145.0 12/23/2012   HDL 67.00 12/23/2012   LDLDIRECT 161.2 12/23/2012   ALT 30 12/23/2012   AST 23 12/23/2012   NA 136 12/23/2012   K 4.7 12/23/2012   CL 101 12/23/2012   CREATININE 0.6 12/23/2012   BUN 10 12/23/2012   CO2 27 12/23/2012   TSH 2.39 12/23/2012    ASSESSMENT AND PLAN:  Discussed the following assessment and plan:  Routine general medical examination at a health care facility - Declined flu vaccine at this time. If her for colonoscopy screening - Plan: EKG 12-Lead  Special screening for malignant neoplasms, colon - Plan: Ambulatory referral to Gastroenterology  Unspecified essential hypertension - Blood pressure readings still open on the high side in the 140s 150s family history of stroke in mom. Begin low-dose medicine followup  Other and unspecified hyperlipidemia - Counseling importance of lifestyle intervention with family history patient is what to do  Family history of diabetes mellitus  Fasting hyperglycemia Expectant management discussed medication benefit versus risk followup in 2-3 months with her blood pressure machine Implement lifestyle changes to avoid getting diabetes vascular disease. Patient Care Team: Madelin Headings, MD as PCP -  General (Internal Medicine) Reinaldo Meeker, MD as Consulting Physician (Neurosurgery) Patient Instructions  Intensify lifestyle interventions. Add bp medication ROV in about 2-3 months with BP monitor  To check may do labs at that point.   Neta Mends. Anay Rathe M.D. Health Maintenance  Topic Date Due  . Colonoscopy  05/28/2012  . Influenza Vaccine  10/13/2013  . Mammogram  06/06/2014  . Tetanus/tdap  02/14/2015   Health Maintenance Review }

## 2012-12-30 NOTE — Patient Instructions (Signed)
Intensify lifestyle interventions. Add bp medication ROV in about 2-3 months with BP monitor  To check may do labs at that point.

## 2013-01-21 ENCOUNTER — Encounter: Payer: Self-pay | Admitting: Internal Medicine

## 2013-01-22 ENCOUNTER — Other Ambulatory Visit: Payer: Self-pay | Admitting: Internal Medicine

## 2013-02-18 ENCOUNTER — Ambulatory Visit (INDEPENDENT_AMBULATORY_CARE_PROVIDER_SITE_OTHER): Payer: No Typology Code available for payment source | Admitting: Internal Medicine

## 2013-02-18 ENCOUNTER — Encounter: Payer: Self-pay | Admitting: Internal Medicine

## 2013-02-18 VITALS — BP 130/92 | HR 86 | Temp 98.0°F | Wt 171.0 lb

## 2013-02-18 DIAGNOSIS — J019 Acute sinusitis, unspecified: Secondary | ICD-10-CM

## 2013-02-18 DIAGNOSIS — J069 Acute upper respiratory infection, unspecified: Secondary | ICD-10-CM

## 2013-02-18 DIAGNOSIS — I10 Essential (primary) hypertension: Secondary | ICD-10-CM

## 2013-02-18 MED ORDER — AMOXICILLIN-POT CLAVULANATE 875-125 MG PO TABS: 1.0000 | ORAL_TABLET | Freq: Two times a day (BID) | ORAL | Status: DC

## 2013-02-18 NOTE — Patient Instructions (Signed)
You have a secondary sinus infection that at this time would be best treated with antibiotics. Your blood pressure is slightly up probably from the decongestants but at this time can continue Sudafed in the day saline nasal washes and monitor your blood pressure. If blood pressure is 160/100 and above then stop the decongestants. Expect significant improvement in 3-5 days contact us if not happening Sinusitis Sinusitis is redness, soreness, and swelling (inflammation) of the paranasal sinuses. Paranasal sinuses are air pockets within the bones of your face (beneath the eyes, the middle of the forehead, or above the eyes). In healthy paranasal sinuses, mucus is able to drain out, and air is able to circulate through them by way of your nose. However, when your paranasal sinuses are inflamed, mucus and air can become trapped. This can allow bacteria and other germs to grow and cause infection. Sinusitis can develop quickly and last only a short time (acute) or continue over a long period (chronic). Sinusitis that lasts for more than 12 weeks is considered chronic.  CAUSES  Causes of sinusitis include:  Allergies.  Structural abnormalities, such as displacement of the cartilage that separates your nostrils (deviated septum), which can decrease the air flow through your nose and sinuses and affect sinus drainage.  Functional abnormalities, such as when the small hairs (cilia) that line your sinuses and help remove mucus do not work properly or are not present. SYMPTOMS  Symptoms of acute and chronic sinusitis are the same. The primary symptoms are pain and pressure around the affected sinuses. Other symptoms include:  Upper toothache.  Earache.  Headache.  Bad breath.  Decreased sense of smell and taste.  A cough, which worsens when you are lying flat.  Fatigue.  Fever.  Thick drainage from your nose, which often is green and may contain pus (purulent).  Swelling and warmth over the  affected sinuses. DIAGNOSIS  Your caregiver will perform a physical exam. During the exam, your caregiver may:  Look in your nose for signs of abnormal growths in your nostrils (nasal polyps).  Tap over the affected sinus to check for signs of infection.  View the inside of your sinuses (endoscopy) with a special imaging device with a light attached (endoscope), which is inserted into your sinuses. If your caregiver suspects that you have chronic sinusitis, one or more of the following tests may be recommended:  Allergy tests.  Nasal culture A sample of mucus is taken from your nose and sent to a lab and screened for bacteria.  Nasal cytology A sample of mucus is taken from your nose and examined by your caregiver to determine if your sinusitis is related to an allergy. TREATMENT  Most cases of acute sinusitis are related to a viral infection and will resolve on their own within 10 days. Sometimes medicines are prescribed to help relieve symptoms (pain medicine, decongestants, nasal steroid sprays, or saline sprays).  However, for sinusitis related to a bacterial infection, your caregiver will prescribe antibiotic medicines. These are medicines that will help kill the bacteria causing the infection.  Rarely, sinusitis is caused by a fungal infection. In theses cases, your caregiver will prescribe antifungal medicine. For some cases of chronic sinusitis, surgery is needed. Generally, these are cases in which sinusitis recurs more than 3 times per year, despite other treatments. HOME CARE INSTRUCTIONS   Drink plenty of water. Water helps thin the mucus so your sinuses can drain more easily.  Use a humidifier.  Inhale steam 3 to 4  times a day (for example, sit in the bathroom with the shower running).  Apply a warm, moist washcloth to your face 3 to 4 times a day, or as directed by your caregiver.  Use saline nasal sprays to help moisten and clean your sinuses.  Take over-the-counter or  prescription medicines for pain, discomfort, or fever only as directed by your caregiver. SEEK IMMEDIATE MEDICAL CARE IF:  You have increasing pain or severe headaches.  You have nausea, vomiting, or drowsiness.  You have swelling around your face.  You have vision problems.  You have a stiff neck.  You have difficulty breathing. MAKE SURE YOU:   Understand these instructions.  Will watch your condition.  Will get help right away if you are not doing well or get worse. Document Released: 01/29/2005 Document Revised: 04/23/2011 Document Reviewed: 02/13/2011 [facility] Patient Information 2014 [place], [place].

## 2013-02-18 NOTE — Progress Notes (Signed)
Chief Complaint  Patient presents with  . Nausea    Cough started a month ago.  Other sx started around Christmas.  Has taken OTC medication.  Low grade fevers only  . Dizziness  . Nasal Congestion  . Sinus Pressure  . Cough  . Fever    HPI: Patient comes in today for SDA for  new problem evaluation. See above sx off and on like cold since early December then worse x mas  Vomited  New years.gi mporved but now  Feels like sinus infection .   Now nausea all the time dizziness and has and pain and drainage.  righ face pain and pressure amost right ear.  Unilateral discolored and bloody drainage.  No plane  Travel ; temp low grade.no chills. Has tried multple deocngestants and tylenol.   BP has been good  On medication usually  128/78. Has been taking decongestants saline ROS: See pertinent positives and negatives per HPI. No cp sob   Past Medical History  Diagnosis Date  . Hx of varicella   . Back pain     Recurrent has seen neurosurgery in past    Family History  Problem Relation Age of Onset  . Cancer Mother     Uterus  . Stroke Mother     died 3   . Hypertension Mother   . Diabetes Maternal Grandmother     History   Social History  . Marital Status: Married    Spouse Name: N/A    Number of Children: N/A  . Years of Education: N/A   Social History Main Topics  . Smoking status: Never Smoker   . Smokeless tobacco: Not on file  . Alcohol Use: Yes  . Drug Use: Not on file  . Sexual Activity: Not on file   Other Topics Concern  . Not on file   Social History Narrative   hh of 2-3  Son in college daughter ru grad not at home      2 dogs  No ets    1-2 per day  etoh   Exercise 5 days per week.    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 Cetirizine HCl (ZYRTEC ALLERGY) 10 MG CAPS Take 10 mg by mouth as  needed.  Marland Kitchen CLIMARA 0.1 MG/24HR patch PLACE 1 PATCH (0.1 MG TOTAL) ONTO THE SKIN ONCE A WEEK.  . diphenhydrAMINE (BENADRYL) 25 MG tablet Take 25 mg by mouth every 6 (six) hours as needed.  Marland Kitchen lisinopril (PRINIVIL,ZESTRIL) 10 MG tablet Take 1 tablet (10 mg total) by mouth daily.  Marland Kitchen amoxicillin-clavulanate (AUGMENTIN) 875-125 MG per tablet Take 1 tablet by mouth 2 (two) times daily. For sinusitis    EXAM:  BP 130/92  Pulse 86  Temp(Src) 98 F (36.7 C) (Oral)  Wt 171 lb (77.565 kg)  SpO2 98%  Body mass index is 30.76 kg/(m^2). bp repeated  WDWN in NAD  quiet respirations;  congested  somewhat hoarse. Non toxic . HEENT: Normocephalic ;atraumatic , Eyes;  PERRL, EOMs  Full, lids and conjunctiva clear,,Ears: no deformities, canals nl, TM landmarks normal clear fluid tighe , Nose: no deformity som crusting mucoid right ;face maxilla and some frontal tender Mouth : OP clear without lesion or edema . Neck: Supple without adenopathy or masses or bruits Chest:  Clear  to A&P without wheezes rales or rhonchi CV:  S1-S2 no gallops or murmurs peripheral perfusion is normal Skin :nl perfusion and no acute rashes  Neuro grossly intact  ASSESSMENT AND PLAN:  Discussed the following assessment and plan:  Acute sinusitis with symptoms > 10 days - righ maxillary and poss frontal   Unspecified essential hypertension - good at home sligly ly elevated with decongestant and illness ;keep fu appt bring monitor  Protracted URI  -Patient advised to return or notify health care team  if symptoms worsen or persist or new concerns arise.  Patient Instructions  You have a secondary sinus infection that at this time would be best treated with antibiotics. Your blood pressure is slightly up probably from the decongestants but at this time can continue Sudafed in the day saline nasal washes and monitor your blood pressure. If blood pressure is 160/100 and above then stop the decongestants. Expect significant  improvement in 3-5 days contact us if not happening Sinusitis Sinusitis is redness, soreness, and swelling (inflammation) of the paranasal sinuses. Paranasal sinuses are air pockets within the bones of your face (beneath the eyes, the middle of the forehead, or above the eyes). In healthy paranasal sinuses, mucus is able to drain out, and air is able to circulate through them by way of your nose. However, when your paranasal sinuses are inflamed, mucus and air can become trapped. This can allow bacteria and other germs to grow and cause infection. Sinusitis can develop quickly and last only a short time (acute) or continue over a long period (chronic). Sinusitis that lasts for more than 12 weeks is considered chronic.  CAUSES  Causes of sinusitis include:  Allergies.  Structural abnormalities, such as displacement of the cartilage that separates your nostrils (deviated septum), which can decrease the air flow through your nose and sinuses and affect sinus drainage.  Functional abnormalities, such as when the small hairs (cilia) that line your sinuses and help remove mucus do not work properly or are not present. SYMPTOMS  Symptoms of acute and chronic sinusitis are the same. The primary symptoms are pain and pressure around the affected sinuses. Other symptoms include:  Upper toothache.  Earache.  Headache.  Bad breath.  Decreased sense of smell and taste.  A cough, which worsens when you are lying flat.  Fatigue.  Fever.  Thick drainage from your nose, which often is green and may contain pus (purulent).  Swelling and warmth over the affected sinuses. DIAGNOSIS  Your caregiver will perform a physical exam. During the exam, your caregiver may:  Look in your nose for signs of abnormal growths in your nostrils (nasal polyps).  Tap over the affected sinus to check for signs of infection.  View the inside of your sinuses (endoscopy) with a special imaging device with a light  attached (endoscope), which is inserted into your sinuses. If your caregiver suspects that you have chronic sinusitis, one or more of the following tests may be recommended:  Allergy tests.  Nasal culture A sample of mucus is taken from your nose and sent to a lab and screened for bacteria.  Nasal cytology A sample of mucus is taken from your nose and examined by your caregiver to determine if your sinusitis is related to an allergy. TREATMENT  Most cases of acute sinusitis are related to a viral infection and will resolve on their own within 10 days. Sometimes medicines are prescribed to help relieve symptoms (pain medicine, decongestants, nasal steroid sprays, or saline  sprays).  However, for sinusitis related to a bacterial infection, your caregiver will prescribe antibiotic medicines. These are medicines that will help kill the bacteria causing the infection.  Rarely, sinusitis is caused by a fungal infection. In theses cases, your caregiver will prescribe antifungal medicine. For some cases of chronic sinusitis, surgery is needed. Generally, these are cases in which sinusitis recurs more than 3 times per year, despite other treatments. HOME CARE INSTRUCTIONS   Drink plenty of water. Water helps thin the mucus so your sinuses can drain more easily.  Use a humidifier.  Inhale steam 3 to 4 times a day (for example, sit in the bathroom with the shower running).  Apply a warm, moist washcloth to your face 3 to 4 times a day, or as directed by your caregiver.  Use saline nasal sprays to help moisten and clean your sinuses.  Take over-the-counter or prescription medicines for pain, discomfort, or fever only as directed by your caregiver. SEEK IMMEDIATE MEDICAL CARE IF:  You have increasing pain or severe headaches.  You have nausea, vomiting, or drowsiness.  You have swelling around your face.  You have vision problems.  You have a stiff neck.  You have difficulty breathing. MAKE  SURE YOU:   Understand these instructions.  Will watch your condition.  Will get help right away if you are not doing well or get worse. Document Released: 01/29/2005 Document Revised: 04/23/2011 Document Reviewed: 02/13/2011 [facility] Patient Information 2014 [place], [place].    Standley Brooking. Sade Hollon M.D.  Pre visit review using our clinic review tool, if applicable. No additional management support is needed unless otherwise documented below in the visit note.

## 2013-02-19 ENCOUNTER — Telehealth: Payer: Self-pay | Admitting: Internal Medicine

## 2013-02-19 NOTE — Telephone Encounter (Signed)
Relevant patient education assigned to patient using Emmi. ° °

## 2013-03-03 ENCOUNTER — Encounter: Payer: Self-pay | Admitting: Internal Medicine

## 2013-03-03 ENCOUNTER — Ambulatory Visit (INDEPENDENT_AMBULATORY_CARE_PROVIDER_SITE_OTHER): Payer: No Typology Code available for payment source | Admitting: Internal Medicine

## 2013-03-03 VITALS — BP 124/80 | Temp 97.9°F | Ht 62.5 in | Wt 168.0 lb

## 2013-03-03 DIAGNOSIS — E785 Hyperlipidemia, unspecified: Secondary | ICD-10-CM | POA: Insufficient documentation

## 2013-03-03 DIAGNOSIS — Z833 Family history of diabetes mellitus: Secondary | ICD-10-CM

## 2013-03-03 DIAGNOSIS — I10 Essential (primary) hypertension: Secondary | ICD-10-CM

## 2013-03-03 DIAGNOSIS — R739 Hyperglycemia, unspecified: Secondary | ICD-10-CM

## 2013-03-03 DIAGNOSIS — R7309 Other abnormal glucose: Secondary | ICD-10-CM

## 2013-03-03 LAB — BASIC METABOLIC PANEL
BUN: 8 mg/dL (ref 6–23)
CALCIUM: 9 mg/dL (ref 8.4–10.5)
CO2: 26 meq/L (ref 19–32)
CREATININE: 0.6 mg/dL (ref 0.4–1.2)
Chloride: 104 mEq/L (ref 96–112)
GFR: 107.96 mL/min (ref >60.00)
Glucose, Bld: 101 mg/dL — ABNORMAL HIGH (ref 70–99)
Potassium: 4.6 mEq/L (ref 3.5–5.1)
Sodium: 138 mEq/L (ref 135–145)

## 2013-03-03 LAB — LIPID PANEL
CHOLESTEROL: 217 mg/dL — AB (ref 0–200)
HDL: 60.4 mg/dL (ref >39.00)
TRIGLYCERIDES: 90 mg/dL (ref 0.0–149.0)
Total CHOL/HDL Ratio: 4
VLDL: 18 mg/dL (ref 0.0–40.0)

## 2013-03-03 LAB — HEMOGLOBIN A1C: HEMOGLOBIN A1C: 5.6 % (ref 4.6–6.5)

## 2013-03-03 NOTE — Patient Instructions (Addendum)
Continue medication for now blood pressure is  In range today . IF cough  persistent or progressive consider change  In  Medication contact us phone or e mail.  Will notify you  of labs when available. Continue lifestyle intervention healthy eating and exercise . cpx and labs in [place] if otherwise normal.

## 2013-03-03 NOTE — Progress Notes (Signed)
Chief Complaint  Patient presents with  . 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 No obvious side effects of the lisinopril states she feels somewhat better with it. Does have a little bit of a cough but was just treated for sinus infection and is significantly better off to the Augmentin. Trying to eat better healthier. ROS: See pertinent positives and negatives per HPI. Minor cough no face pain chest pain shortness of breath  Past Medical History  Diagnosis Date  . Hx of varicella   . Back pain     Recurrent has seen neurosurgery in past    Family History  Problem Relation Age of Onset  . Cancer Mother     Uterus  . Stroke Mother     died 23   . Hypertension Mother   . Diabetes Maternal Grandmother     History   Social History  . Marital Status: Married    Spouse Name: N/A    Number of Children: N/A  . Years of Education: N/A   Social History Main Topics  . Smoking status: Never Smoker   . Smokeless tobacco: None  . Alcohol Use: Yes  . Drug Use: None  . Sexual Activity: None   Other Topics Concern  . None   Social History Narrative   hh of 2-3  Son in college daughter ru grad not at home      2 dogs  No ets    1-2 per day  etoh   Exercise 5 days per week.    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 Cetirizine HCl (ZYRTEC ALLERGY) 10 MG CAPS Take 10 mg by mouth as needed.  Marland Kitchen CLIMARA 0.1 MG/24HR patch PLACE 1 PATCH (0.1 MG TOTAL) ONTO THE SKIN ONCE A WEEK.  . diphenhydrAMINE (BENADRYL) 25 MG tablet Take 25 mg by mouth every 6 (six) hours as needed.  Marland Kitchen lisinopril (PRINIVIL,ZESTRIL) 10 MG tablet Take 1 tablet (10 mg total) by mouth daily.  . [DISCONTINUED] amoxicillin-clavulanate (AUGMENTIN) 875-125 MG per  tablet Take 1 tablet by mouth 2 (two) times daily. For sinusitis    EXAM:  BP 124/80  Temp(Src) 97.9 F (36.6 C) (Oral)  Ht 5' 2.5" (1.588 m)  Wt 168 lb (76.204 kg)  BMI 30.22 kg/m2  Body mass index is 30.22 kg/(m^2).  GENERAL: vitals reviewed and listed above, alert, oriented, appears well hydrated and in no acute distress HEENT: atraumatic, conjunctiva  clear, no obvious abnormalities on inspection of external nose and ears  NECK: no obvious masses on inspection palpation  LUNGS: clear to auscultation bilaterally, no wheezes, rales or rhonchi, good air movement CV: HRRR, no clubbing cyanosis or  peripheral edema nl cap refill  MS: moves all extremities without noticeable focal  abnormality PSYCH: pleasant and cooperative, no obvious depression or anxiety Lab Results  Component Value Date   WBC 4.9 12/23/2012   HGB 13.5 12/23/2012   HCT 39.2 12/23/2012   PLT 323.0 12/23/2012   GLUCOSE 109* 12/23/2012   CHOL 246* 12/23/2012   TRIG 145.0 12/23/2012   HDL 67.00 12/23/2012   LDLDIRECT 161.2 12/23/2012   ALT 30 12/23/2012   AST 23  12/23/2012   NA 136 12/23/2012   K 4.7 12/23/2012   CL 101 12/23/2012   CREATININE 0.6 12/23/2012   BUN 10 12/23/2012   CO2 27 12/23/2012   TSH 2.39 12/23/2012    ASSESSMENT AND PLAN:  Discussed the following assessment and plan:  Unspecified essential hypertension - copntrolled  uncertaqin if cough from resp infection or med for now continue consider change to arb if needed - Plan: Basic metabolic panel, Lipid panel, Hemoglobin A1c  Hyperglycemia - since novemenber check a1c today fasting lab - Plan: Basic metabolic panel, Lipid panel, Hemoglobin A1c  Family history of diabetes mellitus  Hyperlipidemia Sinus infection better per patient -Patient advised to return or notify health care team  if symptoms worsen or persist or new concerns arise.  Patient Instructions  Continue medication for now blood pressure is  In range today . IF  cough  persistent or progressive consider change  In  Medication contact us phone or e mail.  Will notify you  of labs when available. Continue lifestyle intervention healthy eating and exercise . cpx and labs in [place] if otherwise normal.    Mariann Laster K. Ruford Dudzinski M.D.  Pre visit review using our clinic review tool, if applicable. No additional management support is needed unless otherwise documented below in the visit note.

## 2013-03-04 LAB — LDL CHOLESTEROL, DIRECT: Direct LDL: 149.9 mg/dL

## 2013-03-05 ENCOUNTER — Encounter: Payer: Self-pay | Admitting: Internal Medicine

## 2013-03-18 ENCOUNTER — Telehealth: Payer: Self-pay | Admitting: Internal Medicine

## 2013-03-18 NOTE — Telephone Encounter (Signed)
Relevant patient education mailed to patient.  

## 2013-03-20 ENCOUNTER — Ambulatory Visit (AMBULATORY_SURGERY_CENTER): Payer: Self-pay | Admitting: *Deleted

## 2013-03-20 ENCOUNTER — Encounter: Payer: Self-pay | Admitting: Internal Medicine

## 2013-03-20 ENCOUNTER — Other Ambulatory Visit: Payer: Self-pay | Admitting: Family Medicine

## 2013-03-20 VITALS — Ht 62.0 in | Wt 167.8 lb

## 2013-03-20 DIAGNOSIS — Z1211 Encounter for screening for malignant neoplasm of colon: Secondary | ICD-10-CM

## 2013-03-20 MED ORDER — MOVIPREP 100 G PO SOLR: 1.0000 | Freq: Once | ORAL | Status: DC

## 2013-03-20 MED ORDER — LOSARTAN POTASSIUM 50 MG PO TABS: 50.0000 mg | ORAL_TABLET | Freq: Every day | ORAL | Status: DC

## 2013-03-20 NOTE — Telephone Encounter (Signed)
Stop the lisinopril and change to losartan 50 mg once a day. Disp 90 refill x 1

## 2013-03-20 NOTE — Progress Notes (Signed)
No egg or soy allergy. ewm No cpap or home 02 use. ewm Pt states she has post of nausea/vomiting at times. ewm emmi video sent to pt's e mail. ewm Pt states she has anal fissures and occ. On the left she has itching. emw

## 2013-04-03 ENCOUNTER — Encounter: Payer: Self-pay | Admitting: Internal Medicine

## 2013-04-03 ENCOUNTER — Ambulatory Visit (AMBULATORY_SURGERY_CENTER): Payer: No Typology Code available for payment source | Admitting: Internal Medicine

## 2013-04-03 VITALS — BP 120/78 | HR 72 | Temp 97.0°F | Resp 11 | Ht 62.0 in | Wt 167.0 lb

## 2013-04-03 DIAGNOSIS — Z1211 Encounter for screening for malignant neoplasm of colon: Secondary | ICD-10-CM

## 2013-04-03 MED ORDER — SODIUM CHLORIDE 0.9 % IV SOLN: 500.0000 mL | INTRAVENOUS | Status: DC

## 2013-04-03 NOTE — Patient Instructions (Signed)
YOU HAD AN ENDOSCOPIC PROCEDURE TODAY AT THE [facility]: Refer to the procedure report that was given to you for any specific questions about what was found during the examination.  If the procedure report does not answer your questions, please call your gastroenterologist to clarify.  If you requested that your care partner not be given the details of your procedure findings, then the procedure report has been included in a sealed envelope for you to review at your convenience later.  YOU SHOULD EXPECT: Some feelings of bloating in the abdomen. Passage of more gas than usual.  Walking can help get rid of the air that was put into your GI tract during the procedure and reduce the bloating. If you had a lower endoscopy (such as a colonoscopy or flexible sigmoidoscopy) you may notice spotting of blood in your stool or on the toilet paper. If you underwent a bowel prep for your procedure, then you may not have a normal bowel movement for a few days.  DIET: Your first meal following the procedure should be a light meal and then it is ok to progress to your normal diet.  A half-sandwich or bowl of soup is an example of a good first meal.  Heavy or fried foods are harder to digest and may make you feel nauseous or bloated.  Likewise meals heavy in dairy and vegetables can cause extra gas to form and this can also increase the bloating.  Drink plenty of fluids but you should avoid alcoholic beverages for 24 hours.  TRY TO INCREASE THE FIBER IN YOUR DIET.  ACTIVITY: Your care partner should take you home directly after the procedure.  You should plan to take it easy, moving slowly for the rest of the day.  You can resume normal activity the day after the procedure however you should NOT DRIVE or use heavy machinery for 24 hours (because of the sedation medicines used during the test).    SYMPTOMS TO REPORT IMMEDIATELY: A gastroenterologist can be reached at any hour.  During normal business hours,  8:30 AM to 5:00 PM Monday through Friday, call [phone].  After hours and on weekends, please call the GI answering service at [phone] who will take a message and have the physician on call contact you.   Following lower endoscopy (colonoscopy or flexible sigmoidoscopy):  Excessive amounts of blood in the stool  Significant tenderness or worsening of abdominal pains  Swelling of the abdomen that is new, acute  Fever of 100F or higher  FOLLOW UP: If any biopsies were taken you will be contacted by phone or by letter within the next 1-3 weeks.  Call your gastroenterologist if you have not heard about the biopsies in 3 weeks.  Our staff will call the home number listed on your records the next business day following your procedure to check on you and address any questions or concerns that you may have at that time regarding the information given to you following your procedure. This is a courtesy call and so if there is no answer at the home number and we have not heard from you through the emergency physician on call, we will assume that you have returned to your regular daily activities without incident.  SIGNATURES/CONFIDENTIALITY: You and/or your care partner have signed paperwork which will be entered into your electronic medical record.  These signatures attest to the fact that that the information above on your After Visit Summary has been reviewed and is   Full responsibility of the confidentiality of this discharge information lies with you and/or your care-partner.

## 2013-04-03 NOTE — Op Note (Signed)
[place]  [address] [place], [address]   COLONOSCOPY PROCEDURE REPORT  PATIENT: Jacqueline, Beasley  MR#: [id number] BIRTHDATE: 04-05-1960 , 52  yrs. old GENDER: Female ENDOSCOPIST: Jerene Bears, MD REFERRED VE:[facility] Darnelle Going, M.D. PROCEDURE DATE:  04/03/2013 PROCEDURE:   Colonoscopy, screening First Screening Colonoscopy - Avg.  risk and is 50 yrs.  old or older Yes.  Prior Negative Screening - Now for repeat screening. N/A  History of Adenoma - Now for follow-up colonoscopy & has been > or = to 3 yrs.  N/A  Polyps Removed Today? No.  Recommend repeat exam, <10 yrs? No. ASA CLASS:   Class II INDICATIONS:average risk screening and first colonoscopy. MEDICATIONS: MAC sedation, administered by CRNA and propofol (Diprivan) 400mg  IV  DESCRIPTION OF PROCEDURE:   After the risks benefits and alternatives of the procedure were thoroughly explained, informed consent was obtained.  A digital rectal exam revealed no abnormalities of the rectum.   The [id number]  endoscope was introduced through the anus and advanced to the cecum, which was identified by both the appendix and ileocecal valve. No adverse events experienced.   The quality of the prep was good, using MoviPrep  The instrument was then slowly withdrawn as the colon was fully examined.   COLON FINDINGS: Mild diverticulosis was noted in the ascending traverse colon and proximal transverse colon.   The colon was otherwise normal.  There was no inflammation, polyps or cancers were seen.  Retroflexed views revealed no abnormalities. The time to cecum=3 minutes 51 seconds.  Withdrawal time=9 minutes 42 seconds.  The scope was withdrawn and the procedure completed. COMPLICATIONS: There were no complications.  ENDOSCOPIC IMPRESSION: 1.   Mild diverticulosis was noted in the ascending traverse colon and proximal transverse colon 2.   The colon was otherwise normal  RECOMMENDATIONS: 1.  High fiber  diet 2.  You should continue to follow colorectal cancer screening guidelines for "routine risk" patients with a repeat colonoscopy in 10 years.  There is no need for FOBT (stool) testing for at least 5 years.   eSigned:  Jerene Bears, MD 04/03/2013 10:53 AM   cc: The Patient and Burnis Medin, MD

## 2013-04-06 ENCOUNTER — Telehealth: Payer: Self-pay | Admitting: *Deleted

## 2013-04-06 NOTE — Telephone Encounter (Signed)
Left message that we called for f/u 

## 2013-05-11 ENCOUNTER — Encounter: Payer: Self-pay | Admitting: Internal Medicine

## 2013-06-10 ENCOUNTER — Other Ambulatory Visit: Payer: Self-pay | Admitting: Internal Medicine

## 2013-06-12 ENCOUNTER — Encounter: Payer: Self-pay | Admitting: Internal Medicine

## 2013-06-15 ENCOUNTER — Other Ambulatory Visit: Payer: Self-pay | Admitting: Family Medicine

## 2013-06-15 MED ORDER — LISINOPRIL 10 MG PO TABS: 10.0000 mg | ORAL_TABLET | Freq: Every day | ORAL | Status: DC

## 2013-06-15 NOTE — Telephone Encounter (Signed)
She was on lisinopril 10 mg per day before changing  cuase of cough ans we were trying to go back to lisinopril  Please send in 90 days lisinopril and refill x 1

## 2013-07-16 ENCOUNTER — Other Ambulatory Visit: Payer: Self-pay

## 2013-07-16 DIAGNOSIS — Z1231 Encounter for screening mammogram for malignant neoplasm of breast: Secondary | ICD-10-CM

## 2013-07-27 ENCOUNTER — Ambulatory Visit
Admission: RE | Admit: 2013-07-27 | Discharge: 2013-07-27 | Disposition: A | Discharge status: Home / Self Care | Payer: PRIVATE HEALTH INSURANCE | Source: Ambulatory Visit

## 2013-07-27 DIAGNOSIS — Z1231 Encounter for screening mammogram for malignant neoplasm of breast: Secondary | ICD-10-CM

## 2013-09-03 ENCOUNTER — Encounter: Payer: Self-pay | Admitting: Internal Medicine

## 2013-09-03 ENCOUNTER — Ambulatory Visit (INDEPENDENT_AMBULATORY_CARE_PROVIDER_SITE_OTHER): Payer: PRIVATE HEALTH INSURANCE | Admitting: Internal Medicine

## 2013-09-03 VITALS — BP 140/80 | HR 79 | Temp 98.0°F | Ht 62.5 in | Wt 169.0 lb

## 2013-09-03 DIAGNOSIS — I10 Essential (primary) hypertension: Secondary | ICD-10-CM

## 2013-09-03 DIAGNOSIS — J019 Acute sinusitis, unspecified: Secondary | ICD-10-CM

## 2013-09-03 MED ORDER — AMOXICILLIN-POT CLAVULANATE 875-125 MG PO TABS: 1.0000 | ORAL_TABLET | Freq: Two times a day (BID) | ORAL | Status: DC

## 2013-09-03 NOTE — Patient Instructions (Addendum)
Add antibiotic for the sinusitis. and saline. Add nasal cortisone. Flonase   and nasacort.  Even in allergy season for prevention of infection Repeat Bp was 140/80  Continue to monitor.  Sinusitis Sinusitis is redness, soreness, and inflammation of the paranasal sinuses. Paranasal sinuses are air pockets within the bones of your face (beneath the eyes, the middle of the forehead, or above the eyes). In healthy paranasal sinuses, mucus is able to drain out, and air is able to circulate through them by way of your nose. However, when your paranasal sinuses are inflamed, mucus and air can become trapped. This can allow bacteria and other germs to grow and cause infection. Sinusitis can develop quickly and last only a short time (acute) or continue over a long period (chronic). Sinusitis that lasts for more than 12 weeks is considered chronic.  CAUSES  Causes of sinusitis include:  Allergies.  Structural abnormalities, such as displacement of the cartilage that separates your nostrils (deviated septum), which can decrease the air flow through your nose and sinuses and affect sinus drainage.  Functional abnormalities, such as when the small hairs (cilia) that line your sinuses and help remove mucus do not work properly or are not present. SIGNS AND SYMPTOMS  Symptoms of acute and chronic sinusitis are the same. The primary symptoms are pain and pressure around the affected sinuses. Other symptoms include:  Upper toothache.  Earache.  Headache.  Bad breath.  Decreased sense of smell and taste.  A cough, which worsens when you are lying flat.  Fatigue.  Fever.  Thick drainage from your nose, which often is green and may contain pus (purulent).  Swelling and warmth over the affected sinuses. DIAGNOSIS  Your health care provider will perform a physical exam. During the exam, your health care provider may:  Look in your nose for signs of abnormal growths in your nostrils (nasal  polyps).  Tap over the affected sinus to check for signs of infection.  View the inside of your sinuses (endoscopy) using an imaging device that has a light attached (endoscope). If your health care provider suspects that you have chronic sinusitis, one or more of the following tests may be recommended:  Allergy tests.  Nasal culture. A sample of mucus is taken from your nose, sent to a lab, and screened for bacteria.  Nasal cytology. A sample of mucus is taken from your nose and examined by your health care provider to determine if your sinusitis is related to an allergy. TREATMENT  Most cases of acute sinusitis are related to a viral infection and will resolve on their own within 10 days. Sometimes medicines are prescribed to help relieve symptoms (pain medicine, decongestants, nasal steroid sprays, or saline sprays).  However, for sinusitis related to a bacterial infection, your health care provider will prescribe antibiotic medicines. These are medicines that will help kill the bacteria causing the infection.  Rarely, sinusitis is caused by a fungal infection. In theses cases, your health care provider will prescribe antifungal medicine. For some cases of chronic sinusitis, surgery is needed. Generally, these are cases in which sinusitis recurs more than 3 times per year, despite other treatments. HOME CARE INSTRUCTIONS   Drink plenty of water. Water helps thin the mucus so your sinuses can drain more easily.  Use a humidifier.  Inhale steam 3 to 4 times a day (for example, sit in the bathroom with the shower running).  Apply a warm, moist washcloth to your face 3 to 4 times a  day, or as directed by your health care provider.  Use saline nasal sprays to help moisten and clean your sinuses.  Take medicines only as directed by your health care provider.  If you were prescribed either an antibiotic or antifungal medicine, finish it all even if you start to feel better. SEEK IMMEDIATE  MEDICAL CARE IF:  You have increasing pain or severe headaches.  You have nausea, vomiting, or drowsiness.  You have swelling around your face.  You have vision problems.  You have a stiff neck.  You have difficulty breathing. MAKE SURE YOU:   Understand these instructions.  Will watch your condition.  Will get help right away if you are not doing well or get worse. Document Released: 01/29/2005 Document Revised: 06/15/2013 Document Reviewed: 02/13/2011 [facility] Patient Information 2015 [place], [place]. This information is not intended to replace advice given to you by your health care provider. Make sure you discuss any questions you have with your health care provider.

## 2013-09-03 NOTE — Progress Notes (Signed)
Pre visit review using our clinic review tool, if applicable. No additional management support is needed unless otherwise documented below in the visit note.  Chief Complaint  Patient presents with  . Cough    Ongoing x 1 month.  Nasal Drainage is yellow.  Cough is productive of a clear phlegm.  Marland Kitchen Headache  . Nasal Drainage  . Throat Irritation  . Facial Swelling  . Sinus Pressure    HPI: Patient comes in today for SDA for  new problem evaluation. Onset like ? Allergy or  Worse over weekend no fever cough and nasal drainage and pain headache. And face.   Saline nose spray and then sudafed and over the past weeks increases BP and tylenol. And coughing like a crazy person  Drainage in throat. Dark yellow  No sob except when coughing . bp has been controlled in 120/80 range  Thinks lisinopril works better than losartan and doesn't think cough from that. Using cough drops  ROS: See pertinent positives and negatives per HPI. No fever chills wheeze  V d   Past Medical History  Diagnosis Date  . Hx of varicella   . Back pain     Recurrent has seen neurosurgery in past  . Allergy     seasonal  . 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 Hypertension   . History of anal fissures     pt has itching with this at times    Family History  Problem Relation Age of Onset  . Cancer Mother     Uterus  . Stroke Mother     died 52   . Hypertension Mother   . Diabetes Maternal Grandmother   . Colon cancer Neg Hx     History   Social History  . Marital Status: Married    Spouse Name: N/A    Number of Children: N/A  . Years of Education: N/A   Social History Main Topics  . Smoking status: Never Smoker   . Smokeless tobacco: Never Used  . Alcohol Use: 4.2 oz/week    7 Glasses of wine per week     Comment: 1 glass of wine a night  . Drug Use: No  . Sexual Activity: None   Other Topics Concern  . None   Social History Narrative   hh of 2-3  Son in college daughter ru grad not at home        2 dogs  No ets    1-2 per day  etoh   Exercise 5 days per week.    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 cholecalciferol (VITAMIN D) 1000 UNITS tablet Take 2,000 Units by mouth daily.  Marland Kitchen CLIMARA 0.1 MG/24HR patch PLACE 1 PATCH (0.1 MG TOTAL) ONTO THE SKIN ONCE A WEEK.  . diphenhydrAMINE (BENADRYL) 25 MG tablet Take 25 mg by mouth every 6 (six) hours as needed.  Marland Kitchen lisinopril (PRINIVIL,ZESTRIL) 10 MG tablet Take 1 tablet (10 mg total) by mouth daily.  Marland Kitchen amoxicillin-clavulanate (AUGMENTIN) 875-125 MG per tablet Take 1 tablet by mouth every 12 (twelve) hours.  . Cetirizine HCl (ZYRTEC ALLERGY) 10 MG CAPS Take 10 mg by mouth as needed.  . [DISCONTINUED] loratadine (CLARITIN) 10 MG tablet Take 10 mg by mouth daily.    EXAM:  BP 140/80  Pulse 79  Temp(Src) 98 F (36.7 C) (Oral)  Ht 5' 2.5" (1.588 m)  Wt 169 lb (76.658 kg)  BMI 30.40 kg/m2  SpO2 98%  Body mass index is 30.4 kg/(m^2). WDWN in NAD  quiet respirations;  congested  somewhat hoarse. Non toxic .looks allergic or puffy face  HEENT: Normocephalic ;atraumatic , Eyes;  PERRL, EOMs  Full, lids and conjunctiva clear,,Ears: no deformities, canals nl, TM landmarks normal, Nose: no deformitythick mucoid discharge but congested;face minimally tender Mouth : OP clear without lesion or edema . Neck: Supple without adenopathy or masses or bruits Chest:  Clear to A&P without wheezes rales or rhonchi CV:  S1-S2 no gallops or murmurs peripheral perfusion is normal Skin :nl perfusion and no acute rashes  PSYCH: pleasant and cooperative, no obvious depression or anxiety  ASSESSMENT AND PLAN:  Discussed the following assessment and plan:  Acute sinusitis with symptoms > 10 days - add nasal steroid and antibiotic  avoid medicated cough drops  Unspecified essential  hypertension - recheck to ensure control bring monitor to next visit   Expectant management.  -Patient advised to return or notify health care team  if symptoms worsen ,persist or new concerns arise.  Patient Instructions  Add antibiotic for the sinusitis. and saline. Add nasal cortisone. Flonase   and nasacort.  Even in allergy season for prevention of infection Repeat Bp was 140/80  Continue to monitor.  Sinusitis Sinusitis is redness, soreness, and inflammation of the paranasal sinuses. Paranasal sinuses are air pockets within the bones of your face (beneath the eyes, the middle of the forehead, or above the eyes). In healthy paranasal sinuses, mucus is able to drain out, and air is able to circulate through them by way of your nose. However, when your paranasal sinuses are inflamed, mucus and air can become trapped. This can allow bacteria and other germs to grow and cause infection. Sinusitis can develop quickly and last only a short time (acute) or continue over a long period (chronic). Sinusitis that lasts for more than 12 weeks is considered chronic.  CAUSES  Causes of sinusitis include:  Allergies.  Structural abnormalities, such as displacement of the cartilage that separates your nostrils (deviated septum), which can decrease the air flow through your nose and sinuses and affect sinus drainage.  Functional abnormalities, such as when the small hairs (cilia) that line your sinuses and help remove mucus do not work properly or are not present. SIGNS AND SYMPTOMS  Symptoms of acute and chronic sinusitis are the same. The primary symptoms are pain and pressure around the affected sinuses. Other symptoms include:  Upper toothache.  Earache.  Headache.  Bad breath.  Decreased sense of smell and taste.  A cough, which worsens when you are lying flat.  Fatigue.  Fever.  Thick drainage from your nose, which often is green and may contain pus (purulent).  Swelling and  warmth over the affected sinuses. DIAGNOSIS  Your health care provider will perform a physical exam. During the exam, your health care provider may:  Look in your nose for signs of abnormal growths in your nostrils (nasal polyps).  Tap over the affected sinus to check for signs of infection.  View the inside of your sinuses (endoscopy) using an imaging device that has a light attached (endoscope). If your health care provider suspects that you have chronic sinusitis, one or more of the following tests may be recommended:  Allergy tests.  Nasal culture. A sample of mucus  is taken from your nose, sent to a lab, and screened for bacteria.  Nasal cytology. A sample of mucus is taken from your nose and examined by your health care provider to determine if your sinusitis is related to an allergy. TREATMENT  Most cases of acute sinusitis are related to a viral infection and will resolve on their own within 10 days. Sometimes medicines are prescribed to help relieve symptoms (pain medicine, decongestants, nasal steroid sprays, or saline sprays).  However, for sinusitis related to a bacterial infection, your health care provider will prescribe antibiotic medicines. These are medicines that will help kill the bacteria causing the infection.  Rarely, sinusitis is caused by a fungal infection. In theses cases, your health care provider will prescribe antifungal medicine. For some cases of chronic sinusitis, surgery is needed. Generally, these are cases in which sinusitis recurs more than 3 times per year, despite other treatments. HOME CARE INSTRUCTIONS   Drink plenty of water. Water helps thin the mucus so your sinuses can drain more easily.  Use a humidifier.  Inhale steam 3 to 4 times a day (for example, sit in the bathroom with the shower running).  Apply a warm, moist washcloth to your face 3 to 4 times a day, or as directed by your health care provider.  Use saline nasal sprays to help  moisten and clean your sinuses.  Take medicines only as directed by your health care provider.  If you were prescribed either an antibiotic or antifungal medicine, finish it all even if you start to feel better. SEEK IMMEDIATE MEDICAL CARE IF:  You have increasing pain or severe headaches.  You have nausea, vomiting, or drowsiness.  You have swelling around your face.  You have vision problems.  You have a stiff neck.  You have difficulty breathing. MAKE SURE YOU:   Understand these instructions.  Will watch your condition.  Will get help right away if you are not doing well or get worse. Document Released: 01/29/2005 Document Revised: 06/15/2013 Document Reviewed: 02/13/2011 [facility] Patient Information 2015 [place], [place]. This information is not intended to replace advice given to you by your health care provider. Make sure you discuss any questions you have with your health care provider.      Standley Brooking. Aroush Chasse M.D.

## 2013-12-14 ENCOUNTER — Encounter: Payer: Self-pay | Admitting: Internal Medicine

## 2013-12-25 ENCOUNTER — Other Ambulatory Visit (INDEPENDENT_AMBULATORY_CARE_PROVIDER_SITE_OTHER): Payer: PRIVATE HEALTH INSURANCE

## 2013-12-25 DIAGNOSIS — Z0000 Encounter for general adult medical examination without abnormal findings: Secondary | ICD-10-CM

## 2013-12-25 LAB — LIPID PANEL
CHOLESTEROL: 235 mg/dL — AB (ref 0–200)
HDL: 60.6 mg/dL (ref >39.00)
LDL Cholesterol: 151 mg/dL — ABNORMAL HIGH (ref 0–99)
NONHDL: 174.4
Total CHOL/HDL Ratio: 4
Triglycerides: 119 mg/dL (ref 0.0–149.0)
VLDL: 23.8 mg/dL (ref 0.0–40.0)

## 2013-12-25 LAB — CBC WITH DIFFERENTIAL/PLATELET
BASOS PCT: 0.5 % (ref 0.0–3.0)
Basophils Absolute: 0 10*3/uL (ref 0.0–0.1)
EOS PCT: 1.6 % (ref 0.0–5.0)
Eosinophils Absolute: 0.1 10*3/uL (ref 0.0–0.7)
HEMATOCRIT: 41.9 % (ref 36.0–46.0)
HEMOGLOBIN: 13.8 g/dL (ref 12.0–15.0)
LYMPHS ABS: 1.9 10*3/uL (ref 0.7–4.0)
Lymphocytes Relative: 33.5 % (ref 12.0–46.0)
MCHC: 32.9 g/dL (ref 30.0–36.0)
MCV: 90.7 fl (ref 78.0–100.0)
MONO ABS: 0.4 10*3/uL (ref 0.1–1.0)
Monocytes Relative: 7.6 % (ref 3.0–12.0)
NEUTROS ABS: 3.3 10*3/uL (ref 1.4–7.7)
Neutrophils Relative %: 56.8 % (ref 43.0–77.0)
PLATELETS: 356 10*3/uL (ref 150.0–400.0)
RBC: 4.63 Mil/uL (ref 3.87–5.11)
RDW: 12.6 % (ref 11.5–15.5)
WBC: 5.8 10*3/uL (ref 4.0–10.5)

## 2013-12-25 LAB — BASIC METABOLIC PANEL
BUN: 13 mg/dL (ref 6–23)
CHLORIDE: 102 meq/L (ref 96–112)
CO2: 22 mEq/L (ref 19–32)
Calcium: 9.4 mg/dL (ref 8.4–10.5)
Creatinine, Ser: 0.7 mg/dL (ref 0.4–1.2)
GFR: 90.56 mL/min (ref >60.00)
Glucose, Bld: 101 mg/dL — ABNORMAL HIGH (ref 70–99)
POTASSIUM: 4.6 meq/L (ref 3.5–5.1)
Sodium: 137 mEq/L (ref 135–145)

## 2013-12-25 LAB — HEPATIC FUNCTION PANEL
ALBUMIN: 3.8 g/dL (ref 3.5–5.2)
ALT: 25 U/L (ref 0–35)
AST: 21 U/L (ref 0–37)
Alkaline Phosphatase: 48 U/L (ref 39–117)
BILIRUBIN TOTAL: 0.5 mg/dL (ref 0.2–1.2)
Bilirubin, Direct: 0 mg/dL (ref 0.0–0.3)
TOTAL PROTEIN: 7.3 g/dL (ref 6.0–8.3)

## 2013-12-25 LAB — TSH: TSH: 2.53 u[IU]/mL (ref 0.35–4.50)

## 2013-12-28 ENCOUNTER — Encounter: Payer: Self-pay | Admitting: Internal Medicine

## 2014-01-01 ENCOUNTER — Ambulatory Visit (INDEPENDENT_AMBULATORY_CARE_PROVIDER_SITE_OTHER): Payer: PRIVATE HEALTH INSURANCE | Admitting: Internal Medicine

## 2014-01-01 ENCOUNTER — Encounter: Payer: Self-pay | Admitting: Internal Medicine

## 2014-01-01 VITALS — BP 126/84 | HR 71 | Temp 98.0°F | Ht 62.0 in | Wt 165.7 lb

## 2014-01-01 DIAGNOSIS — E785 Hyperlipidemia, unspecified: Secondary | ICD-10-CM

## 2014-01-01 DIAGNOSIS — R7301 Impaired fasting glucose: Secondary | ICD-10-CM

## 2014-01-01 DIAGNOSIS — Z0000 Encounter for general adult medical examination without abnormal findings: Secondary | ICD-10-CM

## 2014-01-01 DIAGNOSIS — I10 Essential (primary) hypertension: Secondary | ICD-10-CM

## 2014-01-01 DIAGNOSIS — Z2821 Immunization not carried out because of patient refusal: Secondary | ICD-10-CM

## 2014-01-01 DIAGNOSIS — Z79890 Hormone replacement therapy (postmenopausal): Secondary | ICD-10-CM

## 2014-01-01 MED ORDER — ESTRADIOL 0.1 MG/24HR TD PTWK: MEDICATED_PATCH | TRANSDERMAL | Status: DC

## 2014-01-01 MED ORDER — LISINOPRIL 10 MG PO TABS: 10.0000 mg | ORAL_TABLET | Freq: Every day | ORAL | Status: DC

## 2014-01-01 NOTE — Patient Instructions (Addendum)
Diet exercise  to control to blood sugar and cholesterol .  Healthy lifestyle includes : At least 150 minutes of exercise weeks  , weight at healthy levels, which is usually   BMI 19-25. Avoid trans fats and processed foods;  Increase fresh fruits and veges to 5 servings per day. And avoid sweet beverages including tea and juice. Mediterranean diet with olive oil and nuts have been noted to be heart and brain healthy . Avoid tobacco products . Limit  alcohol to  7 per week for women and 14 servings for men.  Get adequate sleep . Wear seat belts . Don't text and drive .   Remain on   HRT patch at this time  Consider wean    Age 53 or thereabouts.  Reconsider  Flu vaccine .

## 2014-01-01 NOTE — Progress Notes (Signed)
Pre visit review using our clinic review tool, if applicable. No additional management support is needed unless otherwise documented below in the visit note.  Chief Complaint  Patient presents with  . Annual Exam    HPI: Patient  Jacqueline Beasley  53 y.o. comes in today for Preventive Health Care visit  No major change in health status since last visit . bp 124/78 or such  No se of med  flonase  acitfec and benadryl  Some zyrtec helps allergic sinus issues ocass hot flush doing well on hrt at this time   Health Maintenance  Topic Date Due  . INFLUENZA VACCINE  09/12/2013  . MAMMOGRAM  06/06/2014  . TETANUS/TDAP  02/14/2015  . COLONOSCOPY  04/04/2023   Health Maintenance Review LIFESTYLE:  Exercise:  Treadmill most days   Tobacco/ETS:no Alcohol: 1 per day  Sugar beverages: never  Sleep:  6  Drug use: no Colonoscopy:  utd MAMMO: utd.   ROS:  GEN/ HEENT: No fever, significant weight changes sweats headaches vision problems hearing changes, CV/ PULM; No chest pain shortness of breath cough, syncope,edema  change in exercise tolerance. GI /GU: No adominal pain, vomiting, change in bowel habits. No blood in the stool. No significant GU symptoms. SKIN/HEME: ,no acute skin rashes suspicious lesions or bleeding. No lymphadenopathy, nodules, masses.  NEURO/ PSYCH:  No neurologic signs such as weakness numbness. No depression anxiety. IMM/ Allergy: No unusual infections.  Allergy .   REST of 12 system review negative except as per HPI   Past Medical History  Diagnosis Date  . Hx of varicella   . Back pain     Recurrent has seen neurosurgery in past  . Allergy     seasonal  . 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 Hypertension   . History of anal fissures     pt has itching with this at times    Past Surgical History  Procedure Laterality Date  . Abdominal hysterectomy      total with bso  for fibroids.   . Cesarean section  [id number]  . Breast enhancement surgery  2010     Family History  Problem Relation Age of Onset  . Cancer Mother     Uterus  . Stroke Mother     died 62   . Hypertension Mother   . Diabetes Maternal Grandmother   . Colon cancer Neg Hx     History   Social History  . Marital Status: Married    Spouse Name: N/A    Number of Children: N/A  . Years of Education: N/A   Social History Main Topics  . Smoking status: Never Smoker   . Smokeless tobacco: Never Used  . Alcohol Use: 4.2 oz/week    7 Glasses of wine per week     Comment: 1 glass of wine a night  . Drug Use: No  . Sexual Activity: None   Other Topics Concern  . None   Social History Narrative   hh of 2-3  Son in college daughter ru grad not at home      2 dogs  No ets    1-2 per day  etoh   Exercise 5 days per week.    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  fluticasone (FLONASE) 50 MCG/ACT nasal spray Place 2 sprays into both nostrils daily.  . Cetirizine HCl (ZYRTEC ALLERGY) 10 MG CAPS Take 10 mg by mouth as needed.  . cholecalciferol (VITAMIN D) 1000 UNITS tablet Take 2,000 Units by mouth daily.  . diphenhydrAMINE (BENADRYL) 25 MG tablet Take 25 mg by mouth every 6 (six) hours as needed.  Marland Kitchen estradiol (CLIMARA) 0.1 mg/24hr patch PLACE 1 PATCH (0.1 MG TOTAL) ONTO THE SKIN ONCE A WEEK.  Marland Kitchen lisinopril (PRINIVIL,ZESTRIL) 10 MG tablet Take 1 tablet (10 mg total) by mouth daily.  . [DISCONTINUED] amoxicillin-clavulanate (AUGMENTIN) 875-125 MG per tablet Take 1 tablet by mouth every 12 (twelve) hours.  . [DISCONTINUED] CLIMARA 0.1 MG/24HR patch PLACE 1 PATCH (0.1 MG TOTAL) ONTO THE SKIN ONCE A WEEK.  . [DISCONTINUED] lisinopril (PRINIVIL,ZESTRIL) 10 MG tablet Take 1 tablet (10 mg total) by mouth daily.    EXAM:  BP 126/84 mmHg  Pulse 71  Temp(Src) 98 F (36.7 C) (Oral)  Ht 5\' 2"  (1.575 m)  Wt  165 lb 11.2 oz (75.161 kg)  BMI 30.30 kg/m2  SpO2 97%  Body mass index is 30.3 kg/(m^2).  Physical Exam: Vital signs reviewed ZCH:YIFO is a well-developed well-nourished alert cooperative    who appearsr stated age in no acute distress.  HEENT: normocephalic atraumatic , Eyes: PERRL EOM's full, conjunctiva clear, Nares: paten,t no deformity discharge or tenderness., Ears: no deformity EAC's clear TMs with normal landmarks. Mouth: clear OP, no lesions, edema.  Moist mucous membranes. Dentition in adequate repair. NECK: supple without masses, thyromegaly or bruits. CHEST/PULM:  Clear to auscultation and percussion breath sounds equal no wheeze , rales or rhonchi. No chest wall deformities or tenderness. Breast: normal by inspection . No dimpling, discharge, masses, tenderness or discharge . Implants   CV: PMI is nondisplaced, S1 S2 no gallops, murmurs, rubs. Peripheral pulses are full without delay.No JVD .  ABDOMEN: Bowel sounds normal nontender  No guard or rebound, no hepato splenomegal no CVA tenderness.  No hernia. Extremtities:  No clubbing cyanosis or edema, no acute joint swelling or redness no focal atrophy NEURO:  Oriented x3, cranial nerves 3-12 appear to be intact, no obvious focal weakness,gait within normal limits no abnormal reflexes or asymmetrical SKIN: No acute rashes normal turgor, color, no bruising or petechiae. PSYCH: Oriented, good eye contact, no obvious depression anxiety, cognition and judgment appear normal. LN: no cervical axillary inguinal adenopathy  Lab Results  Component Value Date   WBC 5.8 12/25/2013   HGB 13.8 12/25/2013   HCT 41.9 12/25/2013   PLT 356.0 12/25/2013   GLUCOSE 101* 12/25/2013   CHOL 235* 12/25/2013   TRIG 119.0 12/25/2013   HDL 60.60 12/25/2013   LDLDIRECT 149.9 03/03/2013   LDLCALC 151* 12/25/2013   ALT 25 12/25/2013   AST 21 12/25/2013   NA 137 12/25/2013   K 4.6 12/25/2013   CL 102 12/25/2013   CREATININE 0.7 12/25/2013   BUN  13 12/25/2013   CO2 22 12/25/2013   TSH 2.53 12/25/2013   HGBA1C 5.6 03/03/2013    ASSESSMENT AND PLAN:  Discussed the following assessment and plan:  Routine general medical examination at a health care facility  Essential hypertension  Fasting hyperglycemia  Hyperlipidemia  Postmenopausal HRT (hormone replacement therapy) - surgical  continue at this time consider wean at 52-53 ora s appropraite  Counseled regarding healthy nutrition, exercise, sleep, injury prevention, calcium vit d and healthy weight . To effect above   healthy weight  Loss  Patient Care Team: Burnis Medin, MD as PCP - General (Internal Medicine) Faythe Ghee, MD as Consulting Physician (Neurosurgery) Patient Instructions  Diet exercise  to control to blood sugar and cholesterol .  Healthy lifestyle includes : At least 150 minutes of exercise weeks  , weight at healthy levels, which is usually   BMI 19-25. Avoid trans fats and processed foods;  Increase fresh fruits and veges to 5 servings per day. And avoid sweet beverages including tea and juice. Mediterranean diet with olive oil and nuts have been noted to be heart and brain healthy . Avoid tobacco products . Limit  alcohol to  7 per week for women and 14 servings for men.  Get adequate sleep . Wear seat belts . Don't text and drive .   Remain on   HRT patch at this time  Consider wean    Age 41 or thereabouts.  Reconsider  Flu vaccine .     Standley Brooking. Afton Lavalle M.D.

## 2014-01-02 DIAGNOSIS — Z79890 Hormone replacement therapy (postmenopausal): Secondary | ICD-10-CM | POA: Insufficient documentation

## 2014-01-02 DIAGNOSIS — Z2821 Immunization not carried out because of patient refusal: Secondary | ICD-10-CM | POA: Insufficient documentation

## 2014-04-19 ENCOUNTER — Ambulatory Visit (INDEPENDENT_AMBULATORY_CARE_PROVIDER_SITE_OTHER): Payer: Self-pay | Admitting: Internal Medicine

## 2014-04-19 ENCOUNTER — Encounter: Payer: Self-pay | Admitting: Internal Medicine

## 2014-04-19 VITALS — BP 128/90 | Temp 97.9°F | Ht 62.0 in | Wt 168.0 lb

## 2014-04-19 DIAGNOSIS — J019 Acute sinusitis, unspecified: Secondary | ICD-10-CM

## 2014-04-19 MED ORDER — AMOXICILLIN-POT CLAVULANATE 875-125 MG PO TABS: 1.0000 | ORAL_TABLET | Freq: Two times a day (BID) | ORAL | Status: DC

## 2014-04-19 NOTE — Patient Instructions (Signed)
chest is clear  Add antiboitic for right sided sinus infection. Should note improvment in 3-5 days .  Sinusitis Sinusitis is redness, soreness, and inflammation of the paranasal sinuses. Paranasal sinuses are air pockets within the bones of your face (beneath the eyes, the middle of the forehead, or above the eyes). In healthy paranasal sinuses, mucus is able to drain out, and air is able to circulate through them by way of your nose. However, when your paranasal sinuses are inflamed, mucus and air can become trapped. This can allow bacteria and other germs to grow and cause infection. Sinusitis can develop quickly and last only a short time (acute) or continue over a long period (chronic). Sinusitis that lasts for more than 12 weeks is considered chronic.  CAUSES  Causes of sinusitis include:  Allergies.  Structural abnormalities, such as displacement of the cartilage that separates your nostrils (deviated septum), which can decrease the air flow through your nose and sinuses and affect sinus drainage.  Functional abnormalities, such as when the small hairs (cilia) that line your sinuses and help remove mucus do not work properly or are not present. SIGNS AND SYMPTOMS  Symptoms of acute and chronic sinusitis are the same. The primary symptoms are pain and pressure around the affected sinuses. Other symptoms include:  Upper toothache.  Earache.  Headache.  Bad breath.  Decreased sense of smell and taste.  A cough, which worsens when you are lying flat.  Fatigue.  Fever.  Thick drainage from your nose, which often is green and may contain pus (purulent).  Swelling and warmth over the affected sinuses. DIAGNOSIS  Your health care provider will perform a physical exam. During the exam, your health care provider may:  Look in your nose for signs of abnormal growths in your nostrils (nasal polyps).  Tap over the affected sinus to check for signs of infection.  View the inside  of your sinuses (endoscopy) using an imaging device that has a light attached (endoscope). If your health care provider suspects that you have chronic sinusitis, one or more of the following tests may be recommended:  Allergy tests.  Nasal culture. A sample of mucus is taken from your nose, sent to a lab, and screened for bacteria.  Nasal cytology. A sample of mucus is taken from your nose and examined by your health care provider to determine if your sinusitis is related to an allergy. TREATMENT  Most cases of acute sinusitis are related to a viral infection and will resolve on their own within 10 days. Sometimes medicines are prescribed to help relieve symptoms (pain medicine, decongestants, nasal steroid sprays, or saline sprays).  However, for sinusitis related to a bacterial infection, your health care provider will prescribe antibiotic medicines. These are medicines that will help kill the bacteria causing the infection.  Rarely, sinusitis is caused by a fungal infection. In theses cases, your health care provider will prescribe antifungal medicine. For some cases of chronic sinusitis, surgery is needed. Generally, these are cases in which sinusitis recurs more than 3 times per year, despite other treatments. HOME CARE INSTRUCTIONS   Drink plenty of water. Water helps thin the mucus so your sinuses can drain more easily.  Use a humidifier.  Inhale steam 3 to 4 times a day (for example, sit in the bathroom with the shower running).  Apply a warm, moist washcloth to your face 3 to 4 times a day, or as directed by your health care provider.  Use saline nasal sprays  to help moisten and clean your sinuses.  Take medicines only as directed by your health care provider.  If you were prescribed either an antibiotic or antifungal medicine, finish it all even if you start to feel better. SEEK IMMEDIATE MEDICAL CARE IF:  You have increasing pain or severe headaches.  You have nausea,  vomiting, or drowsiness.  You have swelling around your face.  You have vision problems.  You have a stiff neck.  You have difficulty breathing. MAKE SURE YOU:   Understand these instructions.  Will watch your condition.  Will get help right away if you are not doing well or get worse. Document Released: 01/29/2005 Document Revised: 06/15/2013 Document Reviewed: 02/13/2011 [facility] Patient Information 2015 [place], [place]. This information is not intended to replace advice given to you by your health care provider. Make sure you discuss any questions you have with your health care provider.

## 2014-04-19 NOTE — Progress Notes (Signed)
Pre visit review using our clinic review tool, if applicable. No additional management support is needed unless otherwise documented below in the visit note.  Chief Complaint  Patient presents with  . Cough    On and off since Jan.  . Headache  . Ear Pain  . Sinus Pain/Pressure    HPI: Patient Jacqueline Beasley  comes in today for SDA for  new problem evaluation. Onset since January .  Using measures but now getting worse  Sudafed saline and  Afrin and blood in am .   More on right  Face pressures. Some ear pain  Worse in past few days. Face pain right side and around ear .  No fever but p[oss low grade .  ROS: See pertinent positives and negatives per HPI. No sob  wheezing  Past Medical History  Diagnosis Date  . Hx of varicella   . Back pain     Recurrent has seen neurosurgery in past  . Allergy     seasonal  . 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 Hypertension   . History of anal fissures     pt has itching with this at times    Family History  Problem Relation Age of Onset  . Cancer Mother     Uterus  . Stroke Mother     died 91   . Hypertension Mother   . Diabetes Maternal Grandmother   . Colon cancer Neg Hx     History   Social History  . Marital Status: Married    Spouse Name: N/A  . Number of Children: N/A  . Years of Education: N/A   Social History Main Topics  . Smoking status: Never Smoker   . Smokeless tobacco: Never Used  . Alcohol Use: 4.2 oz/week    7 Glasses of wine per week     Comment: 1 glass of wine a night  . Drug Use: No  . Sexual Activity: Not on file   Other Topics Concern  . None   Social History Narrative   hh of 2-3  Son in college daughter ru grad not at home      2 dogs  No ets    1-2 per day  etoh   Exercise 5 days per week.    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  Cetirizine HCl (ZYRTEC ALLERGY) 10 MG CAPS Take 10 mg by mouth as needed.  . cholecalciferol (VITAMIN D) 1000 UNITS tablet Take 2,000 Units by mouth daily.  Marland Kitchen estradiol (CLIMARA) 0.1 mg/24hr patch PLACE 1 PATCH (0.1 MG TOTAL) ONTO THE SKIN ONCE A WEEK.  . fluticasone (FLONASE) 50 MCG/ACT nasal spray Place 2 sprays into both nostrils daily.  Marland Kitchen lisinopril (PRINIVIL,ZESTRIL) 10 MG tablet Take 1 tablet (10 mg total) by mouth daily.  Marland Kitchen amoxicillin-clavulanate (AUGMENTIN) 875-125 MG per tablet Take 1 tablet by mouth every 12 (twelve) hours.  . diphenhydrAMINE (BENADRYL) 25 MG tablet Take 25 mg by mouth every 6 (six) hours as needed.    EXAM:  BP 128/90 mmHg  Temp(Src) 97.9 F (36.6 C) (Oral)  Ht 5\' 2"  (1.575 m)  Wt 168 lb (76.204 kg)  BMI 30.72 kg/m2  Body mass index is 30.72 kg/(m^2). WDWN in NAD  quiet respirations; mildly congested  somewhat hoarse. Non toxic . HEENT: Normocephalic ;atraumatic , Eyes;  PERRL, EOMs  Full, lids and conjunctiva clear,,Ears: no deformities, canals nl, TM landmarks normal, Nose: no deformity or discharge but congested;face minimally tender Mouth : OP clear without lesion or edema . Neck: Supple without adenopathy or masses or bruits Chest:  Clear to A&P without wheezes rales or rhonchi CV:  S1-S2 no gallops or murmurs peripheral perfusion is normal Skin :nl perfusion and no acute rashes   ASSESSMENT AND PLAN:  Discussed the following assessment and plan:  Acute sinusitis with symptoms > 10 days - right maxillary  with ur sx fdor over a month   -Patient advised to return or notify health care team  if symptoms worsen ,persist or new concerns arise.  Patient Instructions  chest is clear  Add antiboitic for right sided sinus infection. Should note improvment in 3-5 days .  Sinusitis Sinusitis is redness, soreness, and inflammation of the paranasal sinuses. Paranasal sinuses are air pockets within the bones of your face (beneath the eyes, the middle of  the forehead, or above the eyes). In healthy paranasal sinuses, mucus is able to drain out, and air is able to circulate through them by way of your nose. However, when your paranasal sinuses are inflamed, mucus and air can become trapped. This can allow bacteria and other germs to grow and cause infection. Sinusitis can develop quickly and last only a short time (acute) or continue over a long period (chronic). Sinusitis that lasts for more than 12 weeks is considered chronic.  CAUSES  Causes of sinusitis include:  Allergies.  Structural abnormalities, such as displacement of the cartilage that separates your nostrils (deviated septum), which can decrease the air flow through your nose and sinuses and affect sinus drainage.  Functional abnormalities, such as when the small hairs (cilia) that line your sinuses and help remove mucus do not work properly or are not present. SIGNS AND SYMPTOMS  Symptoms of acute and chronic sinusitis are the same. The primary symptoms are pain and pressure around the affected sinuses. Other symptoms include:  Upper toothache.  Earache.  Headache.  Bad breath.  Decreased sense of smell and taste.  A cough, which worsens when you are lying flat.  Fatigue.  Fever.  Thick drainage from your nose, which often is green and may contain pus (purulent).  Swelling and warmth over the affected sinuses. DIAGNOSIS  Your health care provider will perform a physical exam. During the exam, your health care provider may:  Look in your nose for signs of abnormal growths in your nostrils (nasal polyps).  Tap over the affected sinus to check for signs of infection.  View the inside of your sinuses (endoscopy) using an imaging device that has a light attached (endoscope). If your health care provider suspects that you have chronic sinusitis, one or more of the following tests may be recommended:  Allergy tests.  Nasal culture. A sample of mucus is taken from your  nose, sent to a lab, and screened for bacteria.  Nasal cytology. A sample of mucus is taken from your nose and examined by your health care provider to determine if your sinusitis is related to an allergy. TREATMENT  Most cases of acute sinusitis are related to a viral infection and will resolve on their own within 10 days. Sometimes medicines are prescribed to help relieve symptoms (pain medicine, decongestants, nasal steroid sprays, or saline sprays).  However, for sinusitis related to a bacterial infection, your health  care provider will prescribe antibiotic medicines. These are medicines that will help kill the bacteria causing the infection.  Rarely, sinusitis is caused by a fungal infection. In theses cases, your health care provider will prescribe antifungal medicine. For some cases of chronic sinusitis, surgery is needed. Generally, these are cases in which sinusitis recurs more than 3 times per year, despite other treatments. HOME CARE INSTRUCTIONS   Drink plenty of water. Water helps thin the mucus so your sinuses can drain more easily.  Use a humidifier.  Inhale steam 3 to 4 times a day (for example, sit in the bathroom with the shower running).  Apply a warm, moist washcloth to your face 3 to 4 times a day, or as directed by your health care provider.  Use saline nasal sprays to help moisten and clean your sinuses.  Take medicines only as directed by your health care provider.  If you were prescribed either an antibiotic or antifungal medicine, finish it all even if you start to feel better. SEEK IMMEDIATE MEDICAL CARE IF:  You have increasing pain or severe headaches.  You have nausea, vomiting, or drowsiness.  You have swelling around your face.  You have vision problems.  You have a stiff neck.  You have difficulty breathing. MAKE SURE YOU:   Understand these instructions.  Will watch your condition.  Will get help right away if you are not doing well or get  worse. Document Released: 01/29/2005 Document Revised: 06/15/2013 Document Reviewed: 02/13/2011 [facility] Patient Information 2015 [place], [place]. This information is not intended to replace advice given to you by your health care provider. Make sure you discuss any questions you have with your health care provider.      Standley Brooking. Indira Sorenson M.D.

## 2014-06-14 ENCOUNTER — Other Ambulatory Visit: Payer: Self-pay | Admitting: Internal Medicine

## 2014-06-15 NOTE — Telephone Encounter (Signed)
Filled for 1 year in Nov 2015.  Denied.  Refill request is too soon.

## 2014-06-25 ENCOUNTER — Other Ambulatory Visit: Payer: Self-pay

## 2014-06-25 DIAGNOSIS — Z1231 Encounter for screening mammogram for malignant neoplasm of breast: Secondary | ICD-10-CM

## 2014-07-30 ENCOUNTER — Ambulatory Visit
Admission: RE | Admit: 2014-07-30 | Discharge: 2014-07-30 | Disposition: A | Discharge status: Home / Self Care | Payer: Managed Care, Other (non HMO) | Source: Ambulatory Visit

## 2014-07-30 DIAGNOSIS — Z1231 Encounter for screening mammogram for malignant neoplasm of breast: Secondary | ICD-10-CM

## 2015-01-18 ENCOUNTER — Other Ambulatory Visit: Payer: Self-pay | Admitting: Internal Medicine

## 2015-01-21 NOTE — Telephone Encounter (Signed)
Can refill x 2 months    Need ov before  Runs out  For  Further refills

## 2015-01-24 ENCOUNTER — Telehealth: Payer: Self-pay | Admitting: Family Medicine

## 2015-01-24 NOTE — Telephone Encounter (Signed)
Sent to the pharmacy for 2 months.  Message sent to scheduling to help the pt make an appt.

## 2015-01-24 NOTE — Telephone Encounter (Addendum)
Pt has been sch for cpx. Pt last cpx in nov 2015

## 2015-01-24 NOTE — Telephone Encounter (Signed)
Pt needs appt in 2 months per [facility].  Please help the pt to make an appt.  Thanks!

## 2015-02-20 ENCOUNTER — Other Ambulatory Visit: Payer: Self-pay | Admitting: Internal Medicine

## 2015-02-23 NOTE — Telephone Encounter (Signed)
Denied.  Filled for 2 months on 01/24/15.  Has upcoming appt on 03/23/15.  Will fill at that time.

## 2015-03-02 ENCOUNTER — Other Ambulatory Visit: Payer: Self-pay | Admitting: Internal Medicine

## 2015-03-02 NOTE — Telephone Encounter (Signed)
Sent to the pharmacy by e-scribe.  Pt has upcoming cpx on 03/23/15

## 2015-03-15 ENCOUNTER — Encounter: Payer: Self-pay | Admitting: Internal Medicine

## 2015-03-16 ENCOUNTER — Other Ambulatory Visit (INDEPENDENT_AMBULATORY_CARE_PROVIDER_SITE_OTHER): Payer: Managed Care, Other (non HMO)

## 2015-03-16 DIAGNOSIS — Z0000 Encounter for general adult medical examination without abnormal findings: Secondary | ICD-10-CM | POA: Diagnosis not present

## 2015-03-16 LAB — LIPID PANEL
CHOL/HDL RATIO: 3
Cholesterol: 207 mg/dL — ABNORMAL HIGH (ref 0–200)
HDL: 72.5 mg/dL (ref >39.00)
LDL CALC: 116 mg/dL — AB (ref 0–99)
NONHDL: 134.28
Triglycerides: 91 mg/dL (ref 0.0–149.0)
VLDL: 18.2 mg/dL (ref 0.0–40.0)

## 2015-03-16 LAB — HEPATIC FUNCTION PANEL
ALK PHOS: 57 U/L (ref 39–117)
ALT: 20 U/L (ref 0–35)
AST: 19 U/L (ref 0–37)
Albumin: 4.2 g/dL (ref 3.5–5.2)
BILIRUBIN DIRECT: 0 mg/dL (ref 0.0–0.3)
BILIRUBIN TOTAL: 0.3 mg/dL (ref 0.2–1.2)
Total Protein: 6.9 g/dL (ref 6.0–8.3)

## 2015-03-16 LAB — CBC WITH DIFFERENTIAL/PLATELET
BASOS ABS: 0 10*3/uL (ref 0.0–0.1)
BASOS PCT: 0.7 % (ref 0.0–3.0)
EOS ABS: 0.1 10*3/uL (ref 0.0–0.7)
Eosinophils Relative: 2.2 % (ref 0.0–5.0)
HCT: 40.2 % (ref 36.0–46.0)
HEMOGLOBIN: 13.3 g/dL (ref 12.0–15.0)
LYMPHS PCT: 31.3 % (ref 12.0–46.0)
Lymphs Abs: 1.7 10*3/uL (ref 0.7–4.0)
MCHC: 33 g/dL (ref 30.0–36.0)
MCV: 90.9 fl (ref 78.0–100.0)
MONO ABS: 0.3 10*3/uL (ref 0.1–1.0)
Monocytes Relative: 6.4 % (ref 3.0–12.0)
Neutro Abs: 3.2 10*3/uL (ref 1.4–7.7)
Neutrophils Relative %: 59.4 % (ref 43.0–77.0)
Platelets: 359 10*3/uL (ref 150.0–400.0)
RBC: 4.42 Mil/uL (ref 3.87–5.11)
RDW: 13 % (ref 11.5–15.5)
WBC: 5.5 10*3/uL (ref 4.0–10.5)

## 2015-03-16 LAB — BASIC METABOLIC PANEL
BUN: 10 mg/dL (ref 6–23)
CALCIUM: 9.3 mg/dL (ref 8.4–10.5)
CO2: 26 mEq/L (ref 19–32)
CREATININE: 0.7 mg/dL (ref 0.40–1.20)
Chloride: 105 mEq/L (ref 96–112)
GFR: 93.11 mL/min (ref >60.00)
Glucose, Bld: 106 mg/dL — ABNORMAL HIGH (ref 70–99)
Potassium: 5.1 mEq/L (ref 3.5–5.1)
Sodium: 139 mEq/L (ref 135–145)

## 2015-03-16 LAB — TSH: TSH: 2.46 u[IU]/mL (ref 0.35–4.50)

## 2015-03-23 ENCOUNTER — Ambulatory Visit (INDEPENDENT_AMBULATORY_CARE_PROVIDER_SITE_OTHER): Payer: Managed Care, Other (non HMO) | Admitting: Internal Medicine

## 2015-03-23 ENCOUNTER — Encounter: Payer: Self-pay | Admitting: Internal Medicine

## 2015-03-23 VITALS — BP 118/78 | Temp 98.4°F | Ht 62.0 in | Wt 165.5 lb

## 2015-03-23 DIAGNOSIS — IMO0002 Reserved for concepts with insufficient information to code with codable children: Secondary | ICD-10-CM

## 2015-03-23 DIAGNOSIS — Z79899 Other long term (current) drug therapy: Secondary | ICD-10-CM

## 2015-03-23 DIAGNOSIS — Z23 Encounter for immunization: Secondary | ICD-10-CM | POA: Diagnosis not present

## 2015-03-23 DIAGNOSIS — Z0000 Encounter for general adult medical examination without abnormal findings: Secondary | ICD-10-CM

## 2015-03-23 DIAGNOSIS — R7301 Impaired fasting glucose: Secondary | ICD-10-CM | POA: Diagnosis not present

## 2015-03-23 DIAGNOSIS — I10 Essential (primary) hypertension: Secondary | ICD-10-CM

## 2015-03-23 DIAGNOSIS — M6749 Ganglion, multiple sites: Secondary | ICD-10-CM

## 2015-03-23 DIAGNOSIS — J019 Acute sinusitis, unspecified: Secondary | ICD-10-CM | POA: Diagnosis not present

## 2015-03-23 DIAGNOSIS — Z6830 Body mass index (BMI) 30.0-30.9, adult: Secondary | ICD-10-CM

## 2015-03-23 MED ORDER — AMOXICILLIN-POT CLAVULANATE 875-125 MG PO TABS: 1.0000 | ORAL_TABLET | Freq: Two times a day (BID) | ORAL | Status: DC

## 2015-03-23 NOTE — Patient Instructions (Signed)
Intensify lifestyle interventions. To avoid diabetes.  Plan fu labs  In 6 months and recheck. Avoid reg use of aleve sudafed . Can effect  blood pressure readings short term  Treat for sinus infection  Not reponsive to  Other measures.   Health Maintenance, Female Adopting a healthy lifestyle and getting preventive care can go a long way to promote health and wellness. Talk with your health care provider about what schedule of regular examinations is right for you. This is a good chance for you to check in with your provider about disease prevention and staying healthy. In between checkups, there are plenty of things you can do on your own. Experts have done a lot of research about which lifestyle changes and preventive measures are most likely to keep you healthy. Ask your health care provider for more information. WEIGHT AND DIET  Eat a healthy diet  Be sure to include plenty of vegetables, fruits, low-fat dairy products, and lean protein.  Do not eat a lot of foods high in solid fats, added sugars, or salt.  Get regular exercise. This is one of the most important things you can do for your health.  Most adults should exercise for at least 150 minutes each week. The exercise should increase your heart rate and make you sweat (moderate-intensity exercise).  Most adults should also do strengthening exercises at least twice a week. This is in addition to the moderate-intensity exercise.  Maintain a healthy weight  Body mass index (BMI) is a measurement that can be used to identify possible weight problems. It estimates body fat based on height and weight. Your health care provider can help determine your BMI and help you achieve or maintain a healthy weight.  For females 43 years of age and older:   A BMI below 18.5 is considered underweight.  A BMI of 18.5 to 24.9 is normal.  A BMI of 25 to 29.9 is considered overweight.  A BMI of 30 and above is considered obese.  Watch levels  of cholesterol and blood lipids  You should start having your blood tested for lipids and cholesterol at 53 years of age, then have this test every 5 years.  You may need to have your cholesterol levels checked more often if:  Your lipid or cholesterol levels are high.  You are older than 53 years of age.  You are at high risk for heart disease.  CANCER SCREENING   Lung Cancer  Lung cancer screening is recommended for adults 56-68 years old who are at high risk for lung cancer because of a history of smoking.  A yearly low-dose CT scan of the lungs is recommended for people who:  Currently smoke.  Have quit within the past 15 years.  Have at least a 30-pack-year history of smoking. A pack year is smoking an average of one pack of cigarettes a day for 1 year.  Yearly screening should continue until it has been 15 years since you quit.  Yearly screening should stop if you develop a health problem that would prevent you from having lung cancer treatment.  Breast Cancer  Practice breast self-awareness. This means understanding how your breasts normally appear and feel.  It also means doing regular breast self-exams. Let your health care provider know about any changes, no matter how small.  If you are in your 20s or 30s, you should have a clinical breast exam (CBE) by a health care provider every 1-3 years as part of a regular  health exam.  If you are 40 or older, have a CBE every year. Also consider having a breast X-ray (mammogram) every year.  If you have a family history of breast cancer, talk to your health care provider about genetic screening.  If you are at high risk for breast cancer, talk to your health care provider about having an MRI and a mammogram every year.  Breast cancer gene (BRCA) assessment is recommended for women who have family members with BRCA-related cancers. BRCA-related cancers include:  Breast.  Ovarian.  Tubal.  Peritoneal  cancers.  Results of the assessment will determine the need for genetic counseling and BRCA1 and BRCA2 testing. Cervical Cancer Your health care provider may recommend that you be screened regularly for cancer of the pelvic organs (ovaries, uterus, and vagina). This screening involves a pelvic examination, including checking for microscopic changes to the surface of your cervix (Pap test). You may be encouraged to have this screening done every 3 years, beginning at age 67.  For women ages 31-65, health care providers may recommend pelvic exams and Pap testing every 3 years, or they may recommend the Pap and pelvic exam, combined with testing for human papilloma virus (HPV), every 5 years. Some types of HPV increase your risk of cervical cancer. Testing for HPV may also be done on women of any age with unclear Pap test results.  Other health care providers may not recommend any screening for nonpregnant women who are considered low risk for pelvic cancer and who do not have symptoms. Ask your health care provider if a screening pelvic exam is right for you.  If you have had past treatment for cervical cancer or a condition that could lead to cancer, you need Pap tests and screening for cancer for at least 20 years after your treatment. If Pap tests have been discontinued, your risk factors (such as having a new sexual partner) need to be reassessed to determine if screening should resume. Some women have medical problems that increase the chance of getting cervical cancer. In these cases, your health care provider may recommend more frequent screening and Pap tests. Colorectal Cancer  This type of cancer can be detected and often prevented.  Routine colorectal cancer screening usually begins at 53 years of age and continues through 53 years of age.  Your health care provider may recommend screening at an earlier age if you have risk factors for colon cancer.  Your health care provider may also  recommend using home test kits to check for hidden blood in the stool.  A small camera at the end of a tube can be used to examine your colon directly (sigmoidoscopy or colonoscopy). This is done to check for the earliest forms of colorectal cancer.  Routine screening usually begins at age 12.  Direct examination of the colon should be repeated every 5-10 years through 53 years of age. However, you may need to be screened more often if early forms of precancerous polyps or small growths are found. Skin Cancer  Check your skin from head to toe regularly.  Tell your health care provider about any new moles or changes in moles, especially if there is a change in a mole's shape or color.  Also tell your health care provider if you have a mole that is larger than the size of a pencil eraser.  Always use sunscreen. Apply sunscreen liberally and repeatedly throughout the day.  Protect yourself by wearing long sleeves, pants, a wide-brimmed hat, and  sunglasses whenever you are outside. HEART DISEASE, DIABETES, AND HIGH BLOOD PRESSURE   High blood pressure causes heart disease and increases the risk of stroke. High blood pressure is more likely to develop in:  People who have blood pressure in the high end of the normal range (130-139/85-89 mm Hg).  People who are overweight or obese.  People who are African American.  If you are 59-43 years of age, have your blood pressure checked every 3-5 years. If you are 1 years of age or older, have your blood pressure checked every year. You should have your blood pressure measured twice--once when you are at a hospital or clinic, and once when you are not at a hospital or clinic. Record the average of the two measurements. To check your blood pressure when you are not at a hospital or clinic, you can use:  An automated blood pressure machine at a pharmacy.  A home blood pressure monitor.  If you are between 46 years and 47 years old, ask your health  care provider if you should take aspirin to prevent strokes.  Have regular diabetes screenings. This involves taking a blood sample to check your fasting blood sugar level.  If you are at a normal weight and have a low risk for diabetes, have this test once every three years after 53 years of age.  If you are overweight and have a high risk for diabetes, consider being tested at a younger age or more often. PREVENTING INFECTION  Hepatitis B  If you have a higher risk for hepatitis B, you should be screened for this virus. You are considered at high risk for hepatitis B if:  You were born in a country where hepatitis B is common. Ask your health care provider which countries are considered high risk.  Your parents were born in a high-risk country, and you have not been immunized against hepatitis B (hepatitis B vaccine).  You have HIV or AIDS.  You use needles to inject street drugs.  You live with someone who has hepatitis B.  You have had sex with someone who has hepatitis B.  You get hemodialysis treatment.  You take certain medicines for conditions, including cancer, organ transplantation, and autoimmune conditions. Hepatitis C  Blood testing is recommended for:  Everyone born from [birth year] through 1963.  Anyone with known risk factors for hepatitis C. Sexually transmitted infections (STIs)  You should be screened for sexually transmitted infections (STIs) including gonorrhea and chlamydia if:  You are sexually active and are younger than 53 years of age.  You are older than 53 years of age and your health care provider tells you that you are at risk for this type of infection.  Your sexual activity has changed since you were last screened and you are at an increased risk for chlamydia or gonorrhea. Ask your health care provider if you are at risk.  If you do not have HIV, but are at risk, it may be recommended that you take a prescription medicine daily to prevent HIV  infection. This is called pre-exposure prophylaxis (PrEP). You are considered at risk if:  You are sexually active and do not regularly use condoms or know the HIV status of your partner(s).  You take drugs by injection.  You are sexually active with a partner who has HIV. Talk with your health care provider about whether you are at high risk of being infected with HIV. If you choose to begin PrEP, you should first  be tested for HIV. You should then be tested every 3 months for as long as you are taking PrEP.  PREGNANCY   If you are premenopausal and you may become pregnant, ask your health care provider about preconception counseling.  If you may become pregnant, take 400 to 800 micrograms (mcg) of folic acid every day.  If you want to prevent pregnancy, talk to your health care provider about birth control (contraception). OSTEOPOROSIS AND MENOPAUSE   Osteoporosis is a disease in which the bones lose minerals and strength with aging. This can result in serious bone fractures. Your risk for osteoporosis can be identified using a bone density scan.  If you are 50 years of age or older, or if you are at risk for osteoporosis and fractures, ask your health care provider if you should be screened.  Ask your health care provider whether you should take a calcium or vitamin D supplement to lower your risk for osteoporosis.  Menopause may have certain physical symptoms and risks.  Hormone replacement therapy may reduce some of these symptoms and risks. Talk to your health care provider about whether hormone replacement therapy is right for you.  HOME CARE INSTRUCTIONS   Schedule regular health, dental, and eye exams.  Stay current with your immunizations.   Do not use any tobacco products including cigarettes, chewing tobacco, or electronic cigarettes.  If you are pregnant, do not drink alcohol.  If you are breastfeeding, limit how much and how often you drink alcohol.  Limit  alcohol intake to no more than 1 drink per day for nonpregnant women. One drink equals 12 ounces of beer, 5 ounces of wine, or 1 ounces of hard liquor.  Do not use street drugs.  Do not share needles.  Ask your health care provider for help if you need support or information about quitting drugs.  Tell your health care provider if you often feel depressed.  Tell your health care provider if you have ever been abused or do not feel safe at home.   This information is not intended to replace advice given to you by your health care provider. Make sure you discuss any questions you have with your health care provider.   Document Released: 08/14/2010 Document Revised: 02/19/2014 Document Reviewed: 12/31/2012 Elsevier Interactive Patient Education [employer].     Why follow it? Research shows. . Those who follow the Mediterranean diet have a reduced risk of heart disease  . The diet is associated with a reduced incidence of Parkinson's and Alzheimer's diseases . People following the diet may have longer life expectancies and lower rates of chronic diseases  . The Dietary Guidelines for Americans recommends the Mediterranean diet as an eating plan to promote health and prevent disease  What Is the Mediterranean Diet?  . Healthy eating plan based on typical foods and recipes of Mediterranean-style cooking . The diet is primarily a plant based diet; these foods should make up a majority of meals   Starches - Plant based foods should make up a majority of meals - They are an important sources of vitamins, minerals, energy, antioxidants, and fiber - Choose whole grains, foods high in fiber and minimally processed items  - Typical grain sources include wheat, oats, barley, corn, brown rice, bulgar, farro, millet, polenta, couscous  - Various types of beans include chickpeas, lentils, fava beans, black beans, white beans   Fruits  Veggies - Large quantities of antioxidant rich fruits &  veggies; 6 or more  servings  - Vegetables can be eaten raw or lightly drizzled with oil and cooked  - Vegetables common to the traditional Mediterranean Diet include: artichokes, arugula, beets, broccoli, brussel sprouts, cabbage, carrots, celery, collard greens, cucumbers, eggplant, kale, leeks, lemons, lettuce, mushrooms, okra, onions, peas, peppers, potatoes, pumpkin, radishes, rutabaga, shallots, spinach, sweet potatoes, turnips, zucchini - Fruits common to the Mediterranean Diet include: apples, apricots, avocados, cherries, clementines, dates, figs, grapefruits, grapes, melons, nectarines, oranges, peaches, pears, pomegranates, strawberries, tangerines  Fats - Replace butter and margarine with healthy oils, such as olive oil, canola oil, and tahini  - Limit nuts to no more than a handful a day  - Nuts include walnuts, almonds, pecans, pistachios, pine nuts  - Limit or avoid candied, honey roasted or heavily salted nuts - Olives are central to the [employer] - can be eaten whole or used in a variety of dishes   Meats Protein - Limiting red meat: no more than a few times a month - When eating red meat: choose lean cuts and keep the portion to the size of deck of cards - Eggs: approx. 0 to 4 times a week  - Fish and lean poultry: at least 2 a week  - Healthy protein sources include, chicken, [place], lean beef, lamb - Increase intake of seafood such as tuna, salmon, trout, mackerel, shrimp, scallops - Avoid or limit high fat processed meats such as sausage and bacon  Dairy - Include moderate amounts of low fat dairy products  - Focus on healthy dairy such as fat free yogurt, skim milk, low or reduced fat cheese - Limit dairy products higher in fat such as whole or 2% milk, cheese, ice cream  Alcohol - Moderate amounts of red wine is ok  - No more than 5 oz daily for women (all ages) and men older than age 58  - No more than 10 oz of wine daily for men younger than 54  Other - Limit  sweets and other desserts  - Use herbs and spices instead of salt to flavor foods  - Herbs and spices common to the traditional Mediterranean Diet include: basil, bay leaves, chives, cloves, cumin, fennel, garlic, lavender, marjoram, mint, oregano, parsley, pepper, rosemary, sage, savory, sumac, tarragon, thyme   It's not just a diet, it's a lifestyle:  . The Mediterranean diet includes lifestyle factors typical of those in the region  . Foods, drinks and meals are best eaten with others and savored . Daily physical activity is important for overall good health . This could be strenuous exercise like running and aerobics . This could also be more leisurely activities such as walking, housework, yard-work, or taking the stairs . Moderation is the key; a balanced and healthy diet accommodates most foods and drinks . Consider portion sizes and frequency of consumption of certain foods   Meal Ideas & Options:  . Breakfast:  o Whole wheat toast or whole wheat English muffins with peanut butter & hard boiled egg o Steel cut oats topped with apples & cinnamon and skim milk  o Fresh fruit: banana, strawberries, melon, berries, peaches  o Smoothies: strawberries, bananas, greek yogurt, peanut butter o Low fat greek yogurt with blueberries and granola  o Egg white omelet with spinach and mushrooms o Breakfast couscous: whole wheat couscous, apricots, skim milk, cranberries  . Sandwiches:  o Hummus and grilled vegetables (peppers, zucchini, squash) on whole wheat bread   o Grilled chicken on whole wheat pita with lettuce, tomatoes, cucumbers  or tzatziki  o [employer] on whole wheat bread: tuna salad made with greek yogurt, olives, red peppers, capers, green onions o Garlic rosemary lamb pita: lamb sauted with garlic, rosemary, salt & pepper; add lettuce, cucumber, greek yogurt to pita - flavor with lemon juice and black pepper  . Seafood:  o Mediterranean grilled salmon, seasoned with garlic,  basil, parsley, lemon juice and black pepper o Shrimp, lemon, and spinach whole-grain pasta salad made with low fat greek yogurt  o Seared scallops with lemon orzo  o Seared tuna steaks seasoned salt, pepper, coriander topped with tomato mixture of olives, tomatoes, olive oil, minced garlic, parsley, green onions and cappers  . Meats:  o Herbed greek chicken salad with kalamata olives, cucumber, feta  o Red bell peppers stuffed with spinach, bulgur, lean ground beef (or lentils) & topped with feta   o Kebabs: skewers of chicken, tomatoes, onions, zucchini, squash  o [place] burgers: made with red onions, mint, dill, lemon juice, feta cheese topped with roasted red peppers . Vegetarian o Cucumber salad: cucumbers, artichoke hearts, celery, red onion, feta cheese, tossed in olive oil & lemon juice  o Hummus and whole grain pita points with a greek salad (lettuce, tomato, feta, olives, cucumbers, red onion) o Lentil soup with celery, carrots made with vegetable broth, garlic, salt and pepper  o Tabouli salad: parsley, bulgur, mint, scallions, cucumbers, tomato, radishes, lemon juice, olive oil, salt and pepper.

## 2015-03-23 NOTE — Progress Notes (Signed)
Pre visit review using our clinic review tool, if applicable. No additional management support is needed unless otherwise documented below in the visit note.  Chief Complaint  Patient presents with  . Annual Exam    sinsu sx mostly r for amonth  concern about bp  . Hypertension    HPI: Patient  Jacqueline Beasley  55 y.o. comes in today for [place] visit   See  E mail  Message  :  also come concerns   About bp trending up    Had some back and chest discomfort   bp went up to 170 2 sudafed .Marland Kitchen..fed and  Aleve.     Sinus problem congestion dc and pressure face  For a month  using saline flonase etc  Sudafed and aleve.    Cyst nodule left hand getting bigger and now bothering her   Has been there for over a year. Is lright handed   HRT  helping some in hot flushes recenetly ( had total hyst)  Had lost weight eating healthy   Now not so much    fam hx dm and stroke heart  On hrt for about 10 years some in hot flushes to work on [place] Date Due  . INFLUENZA VACCINE  03/22/2016 (Originally 09/13/2014)  . Hepatitis C Screening  03/22/2016 (Originally [birth year])  . HIV Screening  03/22/2016 (Originally 05/28/1975)  . MAMMOGRAM  07/29/2016  . COLONOSCOPY  04/04/2023  . TETANUS/TDAP  03/22/2025   Health Maintenance Review LIFESTYLE:  Exercise:   Yoga and walking  Tobacco/ETS: no Alcohol:  Couple per day  Sugar beverages: none  Sleep:   5  Is most.  Never slept well.  Drug use: no :   ROS:  GEN/ HEENT: No fever, significant weight changes sweats headaches vision problems hearing changes, CV/ PULM; No chest pain shortness of breath cough, syncope,edema  change in exercise tolerance. GI /GU: No adominal pain, vomiting, change in bowel habits. No blood in the stool. No significant GU symptoms. SKIN/HEME: ,no acute skin rashes suspicious lesions or bleeding. No lymphadenopathy, nodules, masses.  NEURO/ PSYCH:  No neurologic signs such as  weakness numbness. No depression anxiety. IMM/ Allergy: No unusual infections.  Allergy .   REST of 12 system review negative except as per HPI   Past Medical History  Diagnosis Date  . Hx of varicella   . Back pain     Recurrent has seen neurosurgery in past  . Allergy     seasonal  . 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 Hypertension   . History of anal fissures     pt has itching with this at times    Past Surgical History  Procedure Laterality Date  . Abdominal hysterectomy      total with bso  for fibroids.   . Cesarean section  [id number]  . Breast enhancement surgery  2010    Family History  Problem Relation Age of Onset  . Cancer Mother     Uterus  . Stroke Mother     died 102   . Hypertension Mother   . Diabetes Maternal Grandmother   . Colon cancer Neg Hx     Social History   Social History  . Marital Status: Married    Spouse Name: N/A  . Number of Children: N/A  . Years of Education: N/A   Social History Main Topics  . Smoking status: Never Smoker   .  Smokeless tobacco: Never Used  . Alcohol Use: 4.2 oz/week    7 Glasses of wine per week     Comment: 1 glass of wine a night  . Drug Use: No  . Sexual Activity: Not Asked   Other Topics Concern  . None   Social History Narrative   hh of 2-3  Son in college daughter ru grad not at home      2 dogs  No ets    1-2 per day  etoh   Exercise 5 days per week.    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 cholecalciferol (VITAMIN D) 1000 UNITS tablet Take 2,000 Units by mouth daily.    Marland Kitchen CLIMARA 0.1 MG/24HR patch PLACE 1 PATCH (0.1 MG TOTAL) ONTO THE SKIN ONCE A WEEK. 8 patch 0  . diphenhydrAMINE (BENADRYL) 25 MG tablet Take 25 mg by mouth every 6 (six) hours as needed.    . fluticasone (FLONASE) 50 MCG/ACT nasal spray Place 2 sprays into both  nostrils daily.    Marland Kitchen lisinopril (PRINIVIL,ZESTRIL) 10 MG tablet TAKE 1 TABLET (10 MG TOTAL) BY MOUTH DAILY. 90 tablet 0  . Cetirizine HCl (ZYRTEC ALLERGY) 10 MG CAPS Take 10 mg by mouth as needed. Reported on 03/23/2015    . amoxicillin-clavulanate (AUGMENTIN) 875-125 MG per tablet Take 1 tablet by mouth every 12 (twelve) hours. 14 tablet 0   No facility-administered medications prior to visit.     EXAM:  BP 118/78 mmHg  Temp(Src) 98.4 F (36.9 C) (Oral)  Ht _0  (1.575 m)  Wt 165 lb 8 oz (75.07 kg)  BMI 30.26 kg/m2  Body mass index is 30.26 kg/(m^2). bp monitor 107 range and 116 range office monitor  Physical Exam: Vital signs reviewed EXM:DYJW is a well-developed well-nourished alert cooperative    who appearsr stated age in no acute distress.  HEENT: normocephalic atraumatic , Eyes: PERRL EOM's full, conjunctiva clear, Nares: paten,t no deformity discharge or tenderness., Ears: no deformity EAC's clear TMs with normal landmarks. Mouth: clear OP, no lesions, edema.  Moist mucous membranes. Dentition in adequate repair. NECK: supple without masses, thyromegaly or bruits. CHEST/PULM:  Clear to auscultation and percussion breath sounds equal no wheeze , rales or rhonchi. No chest wall deformities or tenderness. CV: PMI is nondisplaced, S1 S2 no gallops, murmurs, rubs. Peripheral pulses are full without delay.No JVD . Breast: normal by inspection . No dimpling, discharge, masses, tenderness or discharge . Implants  ABDOMEN: Bowel sounds normal nontender  No guard or rebound, no hepato splenomegal no CVA tenderness.  No hernia. Extremtities:  No clubbing cyanosis or edema, no acute joint swelling or redness no focal atrophy left hand  2 cm soft nodule palmar base of left thumb nl rom .  NEURO:  Oriented x3, cranial nerves 3-12 appear to be intact, no obvious focal weakness,gait within normal limits no abnormal reflexes or asymmetrical SKIN: No acute rashes normal turgor, color, no bruising  or petechiae. PSYCH: Oriented, good eye contact, no obvious depression anxiety, cognition and judgment appear normal. LN: no cervical axillary inguinal adenopathy  Lab Results  Component Value Date   WBC 5.5 03/16/2015   HGB 13.3 03/16/2015   HCT 40.2 03/16/2015   PLT 359.0 03/16/2015   GLUCOSE 106* 03/16/2015   CHOL 207* 03/16/2015  TRIG 91.0 03/16/2015   HDL 72.50 03/16/2015   LDLDIRECT 149.9 03/03/2013   LDLCALC 116* 03/16/2015   ALT 20 03/16/2015   AST 19 03/16/2015   NA 139 03/16/2015   K 5.1 03/16/2015   CL 105 03/16/2015   CREATININE 0.70 03/16/2015   BUN 10 03/16/2015   CO2 26 03/16/2015   TSH 2.46 03/16/2015   HGBA1C 5.6 03/03/2013   BP Readings from Last 3 Encounters:  03/23/15 118/78  04/19/14 128/90  01/01/14 126/84   Wt Readings from Last 3 Encounters:  03/23/15 165 lb 8 oz (75.07 kg)  04/19/14 168 lb (76.204 kg)  01/01/14 165 lb 11.2 oz (75.161 kg)     ASSESSMENT AND PLAN:  Discussed the following assessment and plan:  Visit for preventive health examination  Essential hypertension - bp machine  cofrrelates and 107/78  after resting  office 118/82  Fasting hyperglycemia - at risk lsi and check oin 6 mos  Medication management  Acute sinusitis with symptoms > 10 days - hx of same  failed conservative measures   Cyst in hand - left getting larger  poss  hand referral  - Plan: Ambulatory referral to Hand Surgery  Need for prophylactic vaccination with combined diphtheria-tetanus-pertussis (DTP) vaccine - Plan: Tdap vaccine greater than or equal to 7yo IM  BMI 30.0-30.9,adult a1c in fu  Hep c  6 months  Left had  Cyst  Will send in referral for hand surgeon.   reveiewd med aleve and sudafed and effect on BP   Patient Care Team: Burnis Medin, MD as PCP - General (Internal Medicine) Karie Chimera, MD as Consulting Physician (Neurosurgery) Patient Instructions   Intensify lifestyle interventions. To avoid diabetes.  Plan fu labs  In 6  months and recheck. Avoid reg use of aleve sudafed . Can effect  blood pressure readings short term  Treat for sinus infection  Not reponsive to  Other measures.   Health Maintenance, Female Adopting a healthy lifestyle and getting preventive care can go a long way to promote health and wellness. Talk with your health care provider about what schedule of regular examinations is right for you. This is a good chance for you to check in with your provider about disease prevention and staying healthy. In between checkups, there are plenty of things you can do on your own. Experts have done a lot of research about which lifestyle changes and preventive measures are most likely to keep you healthy. Ask your health care provider for more information. WEIGHT AND DIET  Eat a healthy diet  Be sure to include plenty of vegetables, fruits, low-fat dairy products, and lean protein.  Do not eat a lot of foods high in solid fats, added sugars, or salt.  Get regular exercise. This is one of the most important things you can do for your health.  Most adults should exercise for at least 150 minutes each week. The exercise should increase your heart rate and make you sweat (moderate-intensity exercise).  Most adults should also do strengthening exercises at least twice a week. This is in addition to the moderate-intensity exercise.  Maintain a healthy weight  Body mass index (BMI) is a measurement that can be used to identify possible weight problems. It estimates body fat based on height and weight. Your health care provider can help determine your BMI and help you achieve or maintain a healthy weight.  For females 82 years of age and older:   A BMI below 18.5 is  considered underweight.  A BMI of 18.5 to 24.9 is normal.  A BMI of 25 to 29.9 is considered overweight.  A BMI of 30 and above is considered obese.  Watch levels of cholesterol and blood lipids  You should start having your blood tested  for lipids and cholesterol at 53 years of age, then have this test every 5 years.  You may need to have your cholesterol levels checked more often if:  Your lipid or cholesterol levels are high.  You are older than 53 years of age.  You are at high risk for heart disease.  CANCER SCREENING   Lung Cancer  Lung cancer screening is recommended for adults 10-54 years old who are at high risk for lung cancer because of a history of smoking.  A yearly low-dose CT scan of the lungs is recommended for people who:  Currently smoke.  Have quit within the past 15 years.  Have at least a 30-pack-year history of smoking. A pack year is smoking an average of one pack of cigarettes a day for 1 year.  Yearly screening should continue until it has been 15 years since you quit.  Yearly screening should stop if you develop a health problem that would prevent you from having lung cancer treatment.  Breast Cancer  Practice breast self-awareness. This means understanding how your breasts normally appear and feel.  It also means doing regular breast self-exams. Let your health care provider know about any changes, no matter how small.  If you are in your 20s or 30s, you should have a clinical breast exam (CBE) by a health care provider every 1-3 years as part of a regular health exam.  If you are 44 or older, have a CBE every year. Also consider having a breast X-ray (mammogram) every year.  If you have a family history of breast cancer, talk to your health care provider about genetic screening.  If you are at high risk for breast cancer, talk to your health care provider about having an MRI and a mammogram every year.  Breast cancer gene (BRCA) assessment is recommended for women who have family members with BRCA-related cancers. BRCA-related cancers include:  Breast.  Ovarian.  Tubal.  Peritoneal cancers.  Results of the assessment will determine the need for genetic counseling and  BRCA1 and BRCA2 testing. Cervical Cancer Your health care provider may recommend that you be screened regularly for cancer of the pelvic organs (ovaries, uterus, and vagina). This screening involves a pelvic examination, including checking for microscopic changes to the surface of your cervix (Pap test). You may be encouraged to have this screening done every 3 years, beginning at age 37.  For women ages 36-65, health care providers may recommend pelvic exams and Pap testing every 3 years, or they may recommend the Pap and pelvic exam, combined with testing for human papilloma virus (HPV), every 5 years. Some types of HPV increase your risk of cervical cancer. Testing for HPV may also be done on women of any age with unclear Pap test results.  Other health care providers may not recommend any screening for nonpregnant women who are considered low risk for pelvic cancer and who do not have symptoms. Ask your health care provider if a screening pelvic exam is right for you.  If you have had past treatment for cervical cancer or a condition that could lead to cancer, you need Pap tests and screening for cancer for at least 20 years after your treatment.  If Pap tests have been discontinued, your risk factors (such as having a new sexual partner) need to be reassessed to determine if screening should resume. Some women have medical problems that increase the chance of getting cervical cancer. In these cases, your health care provider may recommend more frequent screening and Pap tests. Colorectal Cancer  This type of cancer can be detected and often prevented.  Routine colorectal cancer screening usually begins at 53 years of age and continues through 53 years of age.  Your health care provider may recommend screening at an earlier age if you have risk factors for colon cancer.  Your health care provider may also recommend using home test kits to check for hidden blood in the stool.  A small camera at  the end of a tube can be used to examine your colon directly (sigmoidoscopy or colonoscopy). This is done to check for the earliest forms of colorectal cancer.  Routine screening usually begins at age 45.  Direct examination of the colon should be repeated every 5-10 years through 53 years of age. However, you may need to be screened more often if early forms of precancerous polyps or small growths are found. Skin Cancer  Check your skin from head to toe regularly.  Tell your health care provider about any new moles or changes in moles, especially if there is a change in a mole's shape or color.  Also tell your health care provider if you have a mole that is larger than the size of a pencil eraser.  Always use sunscreen. Apply sunscreen liberally and repeatedly throughout the day.  Protect yourself by wearing long sleeves, pants, a wide-brimmed hat, and sunglasses whenever you are outside. HEART DISEASE, DIABETES, AND HIGH BLOOD PRESSURE   High blood pressure causes heart disease and increases the risk of stroke. High blood pressure is more likely to develop in:  People who have blood pressure in the high end of the normal range (130-139/85-89 mm Hg).  People who are overweight or obese.  People who are African American.  If you are 87-57 years of age, have your blood pressure checked every 3-5 years. If you are 49 years of age or older, have your blood pressure checked every year. You should have your blood pressure measured twice--once when you are at a hospital or clinic, and once when you are not at a hospital or clinic. Record the average of the two measurements. To check your blood pressure when you are not at a hospital or clinic, you can use:  An automated blood pressure machine at a pharmacy.  A home blood pressure monitor.  If you are between 79 years and 65 years old, ask your health care provider if you should take aspirin to prevent strokes.  Have regular diabetes  screenings. This involves taking a blood sample to check your fasting blood sugar level.  If you are at a normal weight and have a low risk for diabetes, have this test once every three years after 53 years of age.  If you are overweight and have a high risk for diabetes, consider being tested at a younger age or more often. PREVENTING INFECTION  Hepatitis B  If you have a higher risk for hepatitis B, you should be screened for this virus. You are considered at high risk for hepatitis B if:  You were born in a country where hepatitis B is common. Ask your health care provider which countries are considered high risk.  Your  parents were born in a high-risk country, and you have not been immunized against hepatitis B (hepatitis B vaccine).  You have HIV or AIDS.  You use needles to inject street drugs.  You live with someone who has hepatitis B.  You have had sex with someone who has hepatitis B.  You get hemodialysis treatment.  You take certain medicines for conditions, including cancer, organ transplantation, and autoimmune conditions. Hepatitis C  Blood testing is recommended for:  Everyone born from [birth year] through 1963.  Anyone with known risk factors for hepatitis C. Sexually transmitted infections (STIs)  You should be screened for sexually transmitted infections (STIs) including gonorrhea and chlamydia if:  You are sexually active and are younger than 53 years of age.  You are older than 53 years of age and your health care provider tells you that you are at risk for this type of infection.  Your sexual activity has changed since you were last screened and you are at an increased risk for chlamydia or gonorrhea. Ask your health care provider if you are at risk.  If you do not have HIV, but are at risk, it may be recommended that you take a prescription medicine daily to prevent HIV infection. This is called pre-exposure prophylaxis (PrEP). You are considered at risk  if:  You are sexually active and do not regularly use condoms or know the HIV status of your partner(s).  You take drugs by injection.  You are sexually active with a partner who has HIV. Talk with your health care provider about whether you are at high risk of being infected with HIV. If you choose to begin PrEP, you should first be tested for HIV. You should then be tested every 3 months for as long as you are taking PrEP.  PREGNANCY   If you are premenopausal and you may become pregnant, ask your health care provider about preconception counseling.  If you may become pregnant, take 400 to 800 micrograms (mcg) of folic acid every day.  If you want to prevent pregnancy, talk to your health care provider about birth control (contraception). OSTEOPOROSIS AND MENOPAUSE   Osteoporosis is a disease in which the bones lose minerals and strength with aging. This can result in serious bone fractures. Your risk for osteoporosis can be identified using a bone density scan.  If you are 51 years of age or older, or if you are at risk for osteoporosis and fractures, ask your health care provider if you should be screened.  Ask your health care provider whether you should take a calcium or vitamin D supplement to lower your risk for osteoporosis.  Menopause may have certain physical symptoms and risks.  Hormone replacement therapy may reduce some of these symptoms and risks. Talk to your health care provider about whether hormone replacement therapy is right for you.  HOME CARE INSTRUCTIONS   Schedule regular health, dental, and eye exams.  Stay current with your immunizations.   Do not use any tobacco products including cigarettes, chewing tobacco, or electronic cigarettes.  If you are pregnant, do not drink alcohol.  If you are breastfeeding, limit how much and how often you drink alcohol.  Limit alcohol intake to no more than 1 drink per day for nonpregnant women. One drink equals 12  ounces of beer, 5 ounces of wine, or 1 ounces of hard liquor.  Do not use street drugs.  Do not share needles.  Ask your health care provider for help if you  need support or information about quitting drugs.  Tell your health care provider if you often feel depressed.  Tell your health care provider if you have ever been abused or do not feel safe at home.   This information is not intended to replace advice given to you by your health care provider. Make sure you discuss any questions you have with your health care provider.   Document Released: 08/14/2010 Document Revised: 02/19/2014 Document Reviewed: 12/31/2012 Elsevier Interactive Patient Education [employer].     Why follow it? Research shows. . Those who follow the Mediterranean diet have a reduced risk of heart disease  . The diet is associated with a reduced incidence of Parkinson's and Alzheimer's diseases . People following the diet may have longer life expectancies and lower rates of chronic diseases  . The Dietary Guidelines for Americans recommends the Mediterranean diet as an eating plan to promote health and prevent disease  What Is the Mediterranean Diet?  . Healthy eating plan based on typical foods and recipes of Mediterranean-style cooking . The diet is primarily a plant based diet; these foods should make up a majority of meals   Starches - Plant based foods should make up a majority of meals - They are an important sources of vitamins, minerals, energy, antioxidants, and fiber - Choose whole grains, foods high in fiber and minimally processed items  - Typical grain sources include wheat, oats, barley, corn, brown rice, bulgar, farro, millet, polenta, couscous  - Various types of beans include chickpeas, lentils, fava beans, black beans, white beans   Fruits  Veggies - Large quantities of antioxidant rich fruits & veggies; 6 or more servings  - Vegetables can be eaten raw or lightly drizzled with oil and  cooked  - Vegetables common to the traditional Mediterranean Diet include: artichokes, arugula, beets, broccoli, brussel sprouts, cabbage, carrots, celery, collard greens, cucumbers, eggplant, kale, leeks, lemons, lettuce, mushrooms, okra, onions, peas, peppers, potatoes, pumpkin, radishes, rutabaga, shallots, spinach, sweet potatoes, turnips, zucchini - Fruits common to the Mediterranean Diet include: apples, apricots, avocados, cherries, clementines, dates, figs, grapefruits, grapes, melons, nectarines, oranges, peaches, pears, pomegranates, strawberries, tangerines  Fats - Replace butter and margarine with healthy oils, such as olive oil, canola oil, and tahini  - Limit nuts to no more than a handful a day  - Nuts include walnuts, almonds, pecans, pistachios, pine nuts  - Limit or avoid candied, honey roasted or heavily salted nuts - Olives are central to the [employer] - can be eaten whole or used in a variety of dishes   Meats Protein - Limiting red meat: no more than a few times a month - When eating red meat: choose lean cuts and keep the portion to the size of deck of cards - Eggs: approx. 0 to 4 times a week  - Fish and lean poultry: at least 2 a week  - Healthy protein sources include, chicken, [place], lean beef, lamb - Increase intake of seafood such as tuna, salmon, trout, mackerel, shrimp, scallops - Avoid or limit high fat processed meats such as sausage and bacon  Dairy - Include moderate amounts of low fat dairy products  - Focus on healthy dairy such as fat free yogurt, skim milk, low or reduced fat cheese - Limit dairy products higher in fat such as whole or 2% milk, cheese, ice cream  Alcohol - Moderate amounts of red wine is ok  - No more than 5 oz daily for women (all  ages) and men older than age 43  - No more than 10 oz of wine daily for men younger than 39  Other - Limit sweets and other desserts  - Use herbs and spices instead of salt to flavor foods  - Herbs  and spices common to the traditional Mediterranean Diet include: basil, bay leaves, chives, cloves, cumin, fennel, garlic, lavender, marjoram, mint, oregano, parsley, pepper, rosemary, sage, savory, sumac, tarragon, thyme   It's not just a diet, it's a lifestyle:  . The Mediterranean diet includes lifestyle factors typical of those in the region  . Foods, drinks and meals are best eaten with others and savored . Daily physical activity is important for overall good health . This could be strenuous exercise like running and aerobics . This could also be more leisurely activities such as walking, housework, yard-work, or taking the stairs . Moderation is the key; a balanced and healthy diet accommodates most foods and drinks . Consider portion sizes and frequency of consumption of certain foods   Meal Ideas & Options:  . Breakfast:  o Whole wheat toast or whole wheat English muffins with peanut butter & hard boiled egg o Steel cut oats topped with apples & cinnamon and skim milk  o Fresh fruit: banana, strawberries, melon, berries, peaches  o Smoothies: strawberries, bananas, greek yogurt, peanut butter o Low fat greek yogurt with blueberries and granola  o Egg white omelet with spinach and mushrooms o Breakfast couscous: whole wheat couscous, apricots, skim milk, cranberries  . Sandwiches:  o Hummus and grilled vegetables (peppers, zucchini, squash) on whole wheat bread   o Grilled chicken on whole wheat pita with lettuce, tomatoes, cucumbers or tzatziki  o Tuna salad on whole wheat bread: tuna salad made with greek yogurt, olives, red peppers, capers, green onions o Garlic rosemary lamb pita: lamb sauted with garlic, rosemary, salt & pepper; add lettuce, cucumber, greek yogurt to pita - flavor with lemon juice and black pepper  . Seafood:  o Mediterranean grilled salmon, seasoned with garlic, basil, parsley, lemon juice and black pepper o Shrimp, lemon, and spinach whole-grain pasta salad  made with low fat greek yogurt  o Seared scallops with lemon orzo  o Seared tuna steaks seasoned salt, pepper, coriander topped with tomato mixture of olives, tomatoes, olive oil, minced garlic, parsley, green onions and cappers  . Meats:  o Herbed greek chicken salad with kalamata olives, cucumber, feta  o Red bell peppers stuffed with spinach, bulgur, lean ground beef (or lentils) & topped with feta   o Kebabs: skewers of chicken, tomatoes, onions, zucchini, squash  o [place] burgers: made with red onions, mint, dill, lemon juice, feta cheese topped with roasted red peppers . Vegetarian o Cucumber salad: cucumbers, artichoke hearts, celery, red onion, feta cheese, tossed in olive oil & lemon juice  o Hummus and whole grain pita points with a greek salad (lettuce, tomato, feta, olives, cucumbers, red onion) o Lentil soup with celery, carrots made with vegetable broth, garlic, salt and pepper  o Tabouli salad: parsley, bulgur, mint, scallions, cucumbers, tomato, radishes, lemon juice, olive oil, salt and pepper.         Standley Brooking. Panosh M.D.

## 2015-03-24 ENCOUNTER — Encounter: Payer: Self-pay | Admitting: Internal Medicine

## 2015-03-25 ENCOUNTER — Other Ambulatory Visit: Payer: Self-pay | Admitting: Internal Medicine

## 2015-06-28 ENCOUNTER — Other Ambulatory Visit: Payer: Self-pay

## 2015-06-28 DIAGNOSIS — Z9882 Breast implant status: Secondary | ICD-10-CM

## 2015-06-28 DIAGNOSIS — Z1231 Encounter for screening mammogram for malignant neoplasm of breast: Secondary | ICD-10-CM

## 2015-07-17 ENCOUNTER — Other Ambulatory Visit: Payer: Self-pay | Admitting: Internal Medicine

## 2015-07-19 ENCOUNTER — Other Ambulatory Visit: Payer: Self-pay | Admitting: General Practice

## 2015-07-19 MED ORDER — CLIMARA 0.1 MG/24HR TD PTWK: MEDICATED_PATCH | TRANSDERMAL | Status: DC

## 2015-08-01 ENCOUNTER — Ambulatory Visit: Payer: Managed Care, Other (non HMO)

## 2015-09-09 ENCOUNTER — Other Ambulatory Visit: Payer: Self-pay | Admitting: Internal Medicine

## 2015-09-12 ENCOUNTER — Encounter: Payer: Self-pay | Admitting: Internal Medicine

## 2015-09-12 NOTE — Telephone Encounter (Signed)
Sent to the pharmacy by e-scribe.  Pt has upcoming appt on 09/20/15

## 2015-09-12 NOTE — Progress Notes (Signed)
Patient doesn't have malignant hypertesnsion  Just essential  Ht  I dont know if can be changes in  ehr

## 2015-09-13 ENCOUNTER — Other Ambulatory Visit (INDEPENDENT_AMBULATORY_CARE_PROVIDER_SITE_OTHER): Payer: BLUE CROSS/BLUE SHIELD

## 2015-09-13 DIAGNOSIS — I10 Essential (primary) hypertension: Secondary | ICD-10-CM

## 2015-09-13 DIAGNOSIS — E119 Type 2 diabetes mellitus without complications: Secondary | ICD-10-CM | POA: Diagnosis not present

## 2015-09-13 LAB — BASIC METABOLIC PANEL
BUN: 10 mg/dL (ref 6–23)
CHLORIDE: 97 meq/L (ref 96–112)
CO2: 30 mEq/L (ref 19–32)
Calcium: 9.6 mg/dL (ref 8.4–10.5)
Creatinine, Ser: 0.72 mg/dL (ref 0.40–1.20)
GFR: 89.96 mL/min (ref >60.00)
Glucose, Bld: 94 mg/dL (ref 70–99)
POTASSIUM: 3.8 meq/L (ref 3.5–5.1)
SODIUM: 135 meq/L (ref 135–145)

## 2015-09-13 LAB — HEMOGLOBIN A1C: HEMOGLOBIN A1C: 5.4 % (ref 4.6–6.5)

## 2015-09-13 NOTE — Addendum Note (Signed)
Addended by: Elmer Picker on: 09/13/2015 08:06 AM   Modules accepted: Orders

## 2015-09-20 ENCOUNTER — Encounter: Payer: Self-pay | Admitting: Internal Medicine

## 2015-09-20 ENCOUNTER — Ambulatory Visit (INDEPENDENT_AMBULATORY_CARE_PROVIDER_SITE_OTHER): Payer: BLUE CROSS/BLUE SHIELD | Admitting: Internal Medicine

## 2015-09-20 VITALS — BP 130/78 | Temp 97.9°F | Wt 164.8 lb

## 2015-09-20 DIAGNOSIS — R7301 Impaired fasting glucose: Secondary | ICD-10-CM

## 2015-09-20 DIAGNOSIS — I10 Essential (primary) hypertension: Secondary | ICD-10-CM

## 2015-09-20 NOTE — Progress Notes (Signed)
Chief Complaint  Patient presents with  . Follow-up    HPI: Jacqueline Beasley 55 y.o.   Fu bp and sugars  Hyperglycemia  Doing better  The blood type diet changes but basically no or dec red meat anuimal protein and processed foods   inc fruits   Increase fiber .  Also   Less stomach issues and  Sugar seems better .  Exercising with bike now no se of meds  Is pleased with progress  ROS: See pertinent positives and negatives per HPI.  Past Medical History:  Diagnosis Date  . Allergy    seasonal  . 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 Back pain    Recurrent has seen neurosurgery in past  . History of anal fissures    pt has itching with this at times  . Hx of varicella   . Hypertension     Family History  Problem Relation Age of Onset  . Cancer Mother     Uterus  . Stroke Mother     died 21   . Hypertension Mother   . Diabetes Maternal Grandmother   . Colon cancer Neg Hx     Social History   Social History  . Marital status: Married    Spouse name: N/A  . Number of children: N/A  . Years of education: N/A   Social History Main Topics  . Smoking status: Never Smoker  . Smokeless tobacco: Never Used  . Alcohol use 4.2 oz/week    7 Glasses of wine per week     Comment: 1 glass of wine a night  . Drug use: No  . Sexual activity: Not Asked   Other Topics Concern  . None   Social History Narrative   hh of 2-3  Son in college daughter ru grad not at home      2 dogs  No ets    1-2 per day  etoh   Exercise 5 days per week.    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 Cetirizine HCl (ZYRTEC ALLERGY) 10 MG CAPS Take 10 mg by mouth as needed. Reported on 03/23/2015    . cholecalciferol (VITAMIN D) 1000 UNITS tablet Take 2,000 Units by mouth daily.    Marland Kitchen CLIMARA 0.1 MG/24HR patch PLACE 1 PATCH (0.1 MG TOTAL) ONTO  THE SKIN ONCE A WEEK. 8 patch 2  . diphenhydrAMINE (BENADRYL) 25 MG tablet Take 25 mg by mouth every 6 (six) hours as needed.    . fluticasone (FLONASE) 50 MCG/ACT nasal spray Place 2 sprays into both nostrils daily.    Marland Kitchen lisinopril (PRINIVIL,ZESTRIL) 10 MG tablet TAKE 1 TABLET BY MOUTH EVERY DAY 90 tablet 1  . Multiple Vitamin (MULTI VITAMIN PO) Take by mouth.    . TURMERIC PO Take by mouth.    Marland Kitchen amoxicillin-clavulanate (AUGMENTIN) 875-125 MG tablet Take 1 tablet by mouth every 12 (twelve) hours. 14 tablet 0   No facility-administered medications prior to visit.      EXAM:  BP 130/78   Temp 97.9 F (36.6 C) (Oral)   Wt 164 lb 12.8 oz (74.8 kg)   BMI 30.14 kg/m   Body mass index is 30.14 kg/m.  GENERAL: vitals reviewed and listed above, alert, oriented, appears well hydrated and in  no acute distress HEENT: atraumatic, conjunctiva  clear, no obvious abnormalities on inspection of external nose and ears  bp  Rechecked   reviweed labsPSYCH: pleasant and cooperative, no obvious depression or anxiety Lab Results  Component Value Date   WBC 5.5 03/16/2015   HGB 13.3 03/16/2015   HCT 40.2 03/16/2015   PLT 359.0 03/16/2015   GLUCOSE 94 09/13/2015   CHOL 207 (H) 03/16/2015   TRIG 91.0 03/16/2015   HDL 72.50 03/16/2015   LDLDIRECT 149.9 03/03/2013   LDLCALC 116 (H) 03/16/2015   ALT 20 03/16/2015   AST 19 03/16/2015   NA 135 09/13/2015   K 3.8 09/13/2015   CL 97 09/13/2015   CREATININE 0.72 09/13/2015   BUN 10 09/13/2015   CO2 30 09/13/2015   TSH 2.46 03/16/2015   HGBA1C 5.4 09/13/2015    ASSESSMENT AND PLAN:  Discussed the following assessment and plan:  Fasting hyperglycemia - now in normal range lsi continue  check a1c with next cpx  in feb 18  Essential hypertension - controlled  Total visit 16mins > 50% spent counseling and coordinating care as indicated in above note and in instructions to patient .  -Patient advised to return or notify health care team  if  symptoms worsen ,persist or new concerns arise.  Patient Instructions  Continue lifestyle intervention healthy eating and exercise . Labs are good better   Yearly check up with labs and hga 1c  Next February .     Standley Brooking. Panosh M.D.

## 2015-09-20 NOTE — Progress Notes (Signed)
Pre visit review using our clinic review tool, if applicable. No additional management support is needed unless otherwise documented below in the visit note. 

## 2015-09-20 NOTE — Patient Instructions (Signed)
Continue lifestyle intervention healthy eating and exercise . Labs are good better   Yearly check up with labs and hga 1c  Next February .

## 2015-10-06 ENCOUNTER — Ambulatory Visit
Admission: RE | Admit: 2015-10-06 | Discharge: 2015-10-06 | Disposition: A | Discharge status: Home / Self Care | Payer: BLUE CROSS/BLUE SHIELD | Source: Ambulatory Visit

## 2015-10-06 DIAGNOSIS — Z9882 Breast implant status: Secondary | ICD-10-CM

## 2015-10-06 DIAGNOSIS — Z1231 Encounter for screening mammogram for malignant neoplasm of breast: Secondary | ICD-10-CM

## 2015-12-19 ENCOUNTER — Encounter: Payer: Self-pay | Admitting: Internal Medicine

## 2016-02-01 ENCOUNTER — Other Ambulatory Visit: Payer: Self-pay | Admitting: Internal Medicine

## 2016-02-01 NOTE — Telephone Encounter (Signed)
Sent to the pharmacy by e-scribe.  Pt has upcoming appt on 03/26/16.

## 2016-03-16 ENCOUNTER — Other Ambulatory Visit: Payer: Self-pay | Admitting: Internal Medicine

## 2016-03-19 ENCOUNTER — Encounter: Payer: Self-pay | Admitting: Internal Medicine

## 2016-03-19 NOTE — Telephone Encounter (Signed)
Sent to the pharmacy by e-scribe. 

## 2016-03-21 ENCOUNTER — Other Ambulatory Visit (INDEPENDENT_AMBULATORY_CARE_PROVIDER_SITE_OTHER): Payer: BLUE CROSS/BLUE SHIELD

## 2016-03-21 DIAGNOSIS — Z0000 Encounter for general adult medical examination without abnormal findings: Secondary | ICD-10-CM | POA: Diagnosis not present

## 2016-03-21 LAB — BASIC METABOLIC PANEL
BUN: 11 mg/dL (ref 6–23)
CALCIUM: 9.3 mg/dL (ref 8.4–10.5)
CO2: 31 meq/L (ref 19–32)
CREATININE: 0.62 mg/dL (ref 0.40–1.20)
Chloride: 102 mEq/L (ref 96–112)
GFR: 106.69 mL/min (ref >60.00)
GLUCOSE: 96 mg/dL (ref 70–99)
Potassium: 4.6 mEq/L (ref 3.5–5.1)
Sodium: 138 mEq/L (ref 135–145)

## 2016-03-21 LAB — CBC WITH DIFFERENTIAL/PLATELET
BASOS ABS: 0.1 10*3/uL (ref 0.0–0.1)
Basophils Relative: 0.9 % (ref 0.0–3.0)
EOS ABS: 0.1 10*3/uL (ref 0.0–0.7)
Eosinophils Relative: 2 % (ref 0.0–5.0)
HCT: 38.6 % (ref 36.0–46.0)
Hemoglobin: 13.2 g/dL (ref 12.0–15.0)
LYMPHS ABS: 1.9 10*3/uL (ref 0.7–4.0)
Lymphocytes Relative: 34.2 % (ref 12.0–46.0)
MCHC: 34.1 g/dL (ref 30.0–36.0)
MCV: 91 fl (ref 78.0–100.0)
Monocytes Absolute: 0.4 10*3/uL (ref 0.1–1.0)
Monocytes Relative: 7 % (ref 3.0–12.0)
NEUTROS ABS: 3.1 10*3/uL (ref 1.4–7.7)
Neutrophils Relative %: 55.9 % (ref 43.0–77.0)
PLATELETS: 341 10*3/uL (ref 150.0–400.0)
RBC: 4.25 Mil/uL (ref 3.87–5.11)
RDW: 13 % (ref 11.5–15.5)
WBC: 5.6 10*3/uL (ref 4.0–10.5)

## 2016-03-21 LAB — HEPATIC FUNCTION PANEL
ALBUMIN: 4.3 g/dL (ref 3.5–5.2)
ALK PHOS: 48 U/L (ref 39–117)
ALT: 18 U/L (ref 0–35)
AST: 17 U/L (ref 0–37)
BILIRUBIN DIRECT: 0.1 mg/dL (ref 0.0–0.3)
Total Bilirubin: 0.3 mg/dL (ref 0.2–1.2)
Total Protein: 6.3 g/dL (ref 6.0–8.3)

## 2016-03-21 LAB — LIPID PANEL
CHOL/HDL RATIO: 4
Cholesterol: 201 mg/dL — ABNORMAL HIGH (ref 0–200)
HDL: 56.9 mg/dL (ref >39.00)
LDL CALC: 106 mg/dL — AB (ref 0–99)
NonHDL: 144.06
TRIGLYCERIDES: 192 mg/dL — AB (ref 0.0–149.0)
VLDL: 38.4 mg/dL (ref 0.0–40.0)

## 2016-03-21 LAB — TSH: TSH: 3.14 u[IU]/mL (ref 0.35–4.50)

## 2016-03-21 NOTE — Progress Notes (Signed)
Pre visit review using our clinic review tool, if applicable. No additional management support is needed unless otherwise documented below in the visit note.  Chief Complaint  Patient presents with  . Annual Exam    HPI: Patient  Jacqueline Beasley  56 y.o. comes in today for Preventive Health Care visit   On hrt  No hot flushes but wakes  easily ( not new)   Bp not checking except when feels bad and like today  taking med no se noted at this time .   Feels eating  Reasonable   Not sure why Tg were up.   Health Maintenance  Topic Date Due  . INFLUENZA VACCINE  07/12/2016 (Originally 09/13/2015)  . Hepatitis C Screening  03/25/2017 (Originally 20-Apr-1960)  . HIV Screening  03/25/2017 (Originally 05/28/1977)  . MAMMOGRAM  10/05/2017  . COLONOSCOPY  04/04/2023  . TETANUS/TDAP  03/22/2025   Health Maintenance Review LIFESTYLE:  Exercise:  Walk  [place]  Dogs  Tobacco/ETS: n Alcohol:  1 per day  Sugar beverages: no Sleep: not much  Always and now  awakinging  Drug use: no HH of   2 plus animals Work: own  Business   40 +   ROS:  GEN/ HEENT: No fever, significant weight changes sweats headaches vision problems hearing changes, CV/ PULM; No chest pain shortness of breath cough, syncope,edema  change in exercise tolerance. GI /GU: No adominal pain, vomiting, change in bowel habits. No blood in the stool. No significant GU symptoms. SKIN/HEME: ,no acute skin rashes suspicious lesions or bleeding. No lymphadenopathy, nodules, masses.  NEURO/ PSYCH:  No neurologic signs such as weakness numbness. No depression anxiety. IMM/ Allergy: No unusual infections.  Allergy .   REST of 12 system review negative except as per HPI   Past Medical History:  Diagnosis Date  . Allergy    seasonal  . 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 Back pain    Recurrent has seen neurosurgery in past  . History of anal fissures    pt has itching with this at times  . Hx of varicella   . Hypertension     Past  Surgical History:  Procedure Laterality Date  . ABDOMINAL HYSTERECTOMY     total with bso  for fibroids.   Marland Kitchen BREAST ENHANCEMENT SURGERY  2010  . CESAREAN SECTION  [id number]    Family History  Problem Relation Age of Onset  . Cancer Mother     Uterus  . Stroke Mother     died 29   . Hypertension Mother   . Diabetes Maternal Grandmother   . Colon cancer Neg Hx     Social History   Social History  . Marital status: Married    Spouse name: N/A  . Number of children: N/A  . Years of education: N/A   Social History Main Topics  . Smoking status: Never Smoker  . Smokeless tobacco: Never Used  . Alcohol use 4.2 oz/week    7 Glasses of wine per week     Comment: 1 glass of wine a night  . Drug use: No  . Sexual activity: Not Asked   Other Topics Concern  . None   Social History Narrative   hh of 2-3  Son in college daughter ru grad not at home      2 dogs  No ets    1-2 per day  etoh   Exercise 5 days per week.  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 Cetirizine HCl (ZYRTEC ALLERGY) 10 MG CAPS Take 10 mg by mouth as needed. Reported on 03/23/2015    . cholecalciferol (VITAMIN D) 1000 UNITS tablet Take 2,000 Units by mouth daily.    . diphenhydrAMINE (BENADRYL) 25 MG tablet Take 25 mg by mouth every 6 (six) hours as needed.    . fluticasone (FLONASE) 50 MCG/ACT nasal spray Place 2 sprays into both nostrils daily.    Marland Kitchen lisinopril (PRINIVIL,ZESTRIL) 10 MG tablet TAKE 1 TABLET BY MOUTH EVERY DAY 90 tablet 0  . CLIMARA 0.1 MG/24HR patch PLACE 1 PATCH (0.1 MG TOTAL) ONTO THE SKIN ONCE A WEEK. 8 patch 0  . Multiple Vitamin (MULTI VITAMIN PO) Take by mouth.    . TURMERIC PO Take by mouth.     No facility-administered medications prior to visit.      EXAM:  BP 130/88 (BP Location: Right Arm, Patient Position:  Sitting, Cuff Size: Normal)   Temp 98.2 F (36.8 C) (Oral)   Ht 5' 2.5" (1.588 m)   Wt 164 lb (74.4 kg)   BMI 29.52 kg/m   Body mass index is 29.52 kg/m. Wt Readings from Last 3 Encounters:  03/26/16 164 lb (74.4 kg)  09/20/15 164 lb 12.8 oz (74.8 kg)  03/23/15 165 lb 8 oz (75.1 kg)    Physical Exam: Vital signs reviewed [id number] is a well-developed well-nourished alert cooperative    who appearsr stated age in no acute distress.  HEENT: normocephalic atraumatic , Eyes: PERRL EOM's full, conjunctiva clear, Nares: paten,t no deformity discharge or tenderness., Ears: no deformity EAC's clear TMs with normal landmarks. Mouth: clear OP, no lesions, edema.  Moist mucous membranes. Dentition in adequate repair. NECK: supple without masses, thyromegaly or bruits. CHEST/PULM:  Clear to auscultation and percussion breath sounds equal no wheeze , rales or rhonchi. No chest wall deformities or tenderness. Breast: normal by inspection . No dimpling, discharge, masses, tenderness or discharge . Implants  CV: PMI is nondisplaced, S1 S2 no gallops, murmurs, rubs. Peripheral pulses are full without delay.No JVD .  ABDOMEN: Bowel sounds normal nontender  No guard or rebound, no hepato splenomegal no CVA tenderness.  No hernia. Extremtities:  No clubbing cyanosis or edema, no acute joint swelling or redness no focal atrophy NEURO:  Oriented x3, cranial nerves 3-12 appear to be intact, no obvious focal weakness,gait within normal limits no abnormal reflexes or asymmetrical SKIN: No acute rashes normal turgor, color, no bruising or petechiae. PSYCH: Oriented, good eye contact, no obvious depression anxiety, cognition and judgment appear normal. LN: no cervical axillary inguinal adenopathy  Lab Results  Component Value Date   WBC 5.6 03/21/2016   HGB 13.2 03/21/2016   HCT 38.6 03/21/2016   PLT 341.0 03/21/2016   GLUCOSE 96 03/21/2016   CHOL 201 (H) 03/21/2016   TRIG 192.0 (H) 03/21/2016   HDL  56.90 03/21/2016   LDLDIRECT 149.9 03/03/2013   LDLCALC 106 (H) 03/21/2016   ALT 18 03/21/2016   AST 17 03/21/2016   NA 138 03/21/2016   K 4.6 03/21/2016   CL 102 03/21/2016   CREATININE 0.62 03/21/2016   BUN 11 03/21/2016   CO2 31 03/21/2016   TSH 3.14 03/21/2016   HGBA1C 5.4 09/13/2015    BP Readings from Last  3 Encounters:  03/26/16 130/88  09/20/15 130/78  03/23/15 118/78    Lab results reviewed with patient   ASSESSMENT AND PLAN:  Discussed the following assessment and plan:  Visit for preventive health examination  Essential hypertension - monitor see plan  Medication management  Postmenopausal HRT (hormone replacement therapy) - down shift dosing and observe   Insomnia, unspecified type  Elevated cholesterol with elevated triglycerides  Patient Care Team: Burnis Medin, MD as PCP - General (Internal Medicine) Karie Chimera, MD as Consulting Physician (Neurosurgery) Patient Instructions  Intensify lifestyle interventions.  Try off alcohol for a week and track   sleep  Headaches etc   Take blood pressure readings twice a day for 10 14 days and then periodically .To ensure below 140/90   .Send in readings    My chart.  Try decreasing etradiol  Lower dose . Marland Kitchen075  Avoid processed carbs     And continue exercise     Healthy lifestyle includes : At least 150 minutes of exercise weeks  , weight at healthy levels, which is usually   BMI 19-25. Avoid trans fats and processed foods;  Increase fresh fruits and veges to 5 servings per day. And avoid sweet beverages including tea and juice. Mediterranean diet with olive oil and nuts have been noted to be heart and brain healthy . Avoid tobacco products . Limit  alcohol to  7 per week for women and 14 servings for men.  Get adequate sleep . Wear seat belts . Don't text and drive .    Insomnia Insomnia is a sleep disorder that makes it difficult to fall asleep or to stay asleep. Insomnia can cause tiredness  (fatigue), low energy, difficulty concentrating, mood swings, and poor performance at work or school. There are three different ways to classify insomnia:  Difficulty falling asleep.  Difficulty staying asleep.  Waking up too early in the morning. Any type of insomnia can be long-term (chronic) or short-term (acute). Both are common. Short-term insomnia usually lasts for three months or less. Chronic insomnia occurs at least three times a week for longer than three months. What are the causes? Insomnia may be caused by another condition, situation, or substance, such as:  Anxiety.  Certain medicines.  Gastroesophageal reflux disease (GERD) or other gastrointestinal conditions.  Asthma or other breathing conditions.  Restless legs syndrome, sleep apnea, or other sleep disorders.  Chronic pain.  Menopause. This may include hot flashes.  Stroke.  Abuse of alcohol, tobacco, or illegal drugs.  Depression.  Caffeine.  Neurological disorders, such as Alzheimer disease.  An overactive thyroid (hyperthyroidism). The cause of insomnia may not be known. What increases the risk? Risk factors for insomnia include:  Gender. Women are more commonly affected than men.  Age. Insomnia is more common as you get older.  Stress. This may involve your professional or personal life.  Income. Insomnia is more common in people with lower income.  Lack of exercise.  Irregular work schedule or night shifts.  Traveling between different time zones. What are the signs or symptoms? If you have insomnia, trouble falling asleep or trouble staying asleep is the main symptom. This may lead to other symptoms, such as:  Feeling fatigued.  Feeling nervous about going to sleep.  Not feeling rested in the morning.  Having trouble concentrating.  Feeling irritable, anxious, or depressed. How is this treated? Treatment for insomnia depends on the cause. If your insomnia is caused by an  underlying condition, treatment will  focus on addressing the condition. Treatment may also include:  Medicines to help you sleep.  Counseling or therapy.  Lifestyle adjustments. Follow these instructions at home:  Take medicines only as directed by your health care provider.  Keep regular sleeping and waking hours. Avoid naps.  Keep a sleep diary to help you and your health care provider figure out what could be causing your insomnia. Include:  When you sleep.  When you wake up during the night.  How well you sleep.  How rested you feel the next day.  Any side effects of medicines you are taking.  What you eat and drink.  Make your bedroom a comfortable place where it is easy to fall asleep:  Put up shades or special blackout curtains to block light from outside.  Use a white noise machine to block noise.  Keep the temperature cool.  Exercise regularly as directed by your health care provider. Avoid exercising right before bedtime.  Use relaxation techniques to manage stress. Ask your health care provider to suggest some techniques that may work well for you. These may include:  Breathing exercises.  Routines to release muscle tension.  Visualizing peaceful scenes.  Cut back on alcohol, caffeinated beverages, and cigarettes, especially close to bedtime. These can disrupt your sleep.  Do not overeat or eat spicy foods right before bedtime. This can lead to digestive discomfort that can make it hard for you to sleep.  Limit screen use before bedtime. This includes:  Watching TV.  Using your smartphone, tablet, and computer.  Stick to a routine. This can help you fall asleep faster. Try to do a quiet activity, brush your teeth, and go to bed at the same time each night.  Get out of bed if you are still awake after 15 minutes of trying to sleep. Keep the lights down, but try reading or doing a quiet activity. When you feel sleepy, go back to bed.  Make sure that  you drive carefully. Avoid driving if you feel very sleepy.  Keep all follow-up appointments as directed by your health care provider. This is important. Contact a health care provider if:  You are tired throughout the day or have trouble in your daily routine due to sleepiness.  You continue to have sleep problems or your sleep problems get worse. Get help right away if:  You have serious thoughts about hurting yourself or someone else. This information is not intended to replace advice given to you by your health care provider. Make sure you discuss any questions you have with your health care provider. Document Released: 01/27/2000 Document Revised: 07/01/2015 Document Reviewed: 10/30/2013 Elsevier Interactive Patient Education  2017 [place] Choices to Lower Your Triglycerides Triglycerides are a type of fat in your blood. High levels of triglycerides can increase the risk of heart disease and stroke. If your triglyceride levels are high, the foods you eat and your eating habits are very important. Choosing the right foods can help lower your triglycerides. What general guidelines do I need to follow?  Lose weight if you are overweight.  Limit or avoid alcohol.  Fill one half of your plate with vegetables and green salads.  Limit fruit to two servings a day. Choose fruit instead of juice.  Make one fourth of your plate whole grains. Look for the word "whole" as the first word in the ingredient list.  Fill one fourth of your plate with lean protein foods.  Enjoy fatty fish (such as  salmon, mackerel, sardines, and tuna) three times a week.  Choose healthy fats.  Limit foods high in starch and sugar.  Eat more home-cooked food and less restaurant, buffet, and fast food.  Limit fried foods.  Cook foods using methods other than frying.  Limit saturated fats.  Check ingredient lists to avoid foods with partially hydrogenated oils (trans fats) in them. What  foods can I eat? Grains  Whole grains, such as whole wheat or whole grain breads, crackers, cereals, and pasta. Unsweetened oatmeal, bulgur, barley, quinoa, or brown rice. Corn or whole wheat flour tortillas. Vegetables  Fresh or frozen vegetables (raw, steamed, roasted, or grilled). Green salads. Fruits  All fresh, canned (in natural juice), or frozen fruits. Meat and Other Protein Products  Ground beef (85% or leaner), grass-fed beef, or beef trimmed of fat. Skinless chicken or [place]. Ground chicken or [place]. Pork trimmed of fat. All fish and seafood. Eggs. Dried beans, peas, or lentils. Unsalted nuts or seeds. Unsalted canned or dry beans. Dairy  Low-fat dairy products, such as skim or 1% milk, 2% or reduced-fat cheeses, low-fat ricotta or cottage cheese, or plain low-fat yogurt. Fats and Oils  Tub margarines without trans fats. Light or reduced-fat mayonnaise and salad dressings. Avocado. Safflower, olive, or canola oils. Natural peanut or almond butter. The items listed above may not be a complete list of recommended foods or beverages. Contact your dietitian for more options.  What foods are not recommended? Grains  White bread. White pasta. White rice. Cornbread. Bagels, pastries, and croissants. Crackers that contain trans fat. Vegetables  White potatoes. Corn. Creamed or fried vegetables. Vegetables in a cheese sauce. Fruits  Dried fruits. Canned fruit in light or heavy syrup. Fruit juice. Meat and Other Protein Products  Fatty cuts of meat. Ribs, chicken wings, bacon, sausage, bologna, salami, chitterlings, fatback, hot dogs, bratwurst, and packaged luncheon meats. Dairy  Whole or 2% milk, cream, half-and-half, and cream cheese. Whole-fat or sweetened yogurt. Full-fat cheeses. Nondairy creamers and whipped toppings. Processed cheese, cheese spreads, or cheese curds. Sweets and Desserts  Corn syrup, sugars, honey, and molasses. Candy. Jam and jelly. Syrup. Sweetened cereals.  Cookies, pies, cakes, donuts, muffins, and ice cream. Fats and Oils  Butter, stick margarine, lard, shortening, ghee, or bacon fat. Coconut, palm kernel, or palm oils. Beverages  Alcohol. Sweetened drinks (such as sodas, lemonade, and fruit drinks or punches). The items listed above may not be a complete list of foods and beverages to avoid. Contact your dietitian for more information.  This information is not intended to replace advice given to you by your health care provider. Make sure you discuss any questions you have with your health care provider. Document Released: 11/17/2003 Document Revised: 07/07/2015 Document Reviewed: 12/03/2012 Elsevier Interactive Patient Education  2017 [place] K. Panosh M.D.

## 2016-03-26 ENCOUNTER — Ambulatory Visit (INDEPENDENT_AMBULATORY_CARE_PROVIDER_SITE_OTHER): Payer: BLUE CROSS/BLUE SHIELD | Admitting: Internal Medicine

## 2016-03-26 ENCOUNTER — Encounter: Payer: Self-pay | Admitting: Internal Medicine

## 2016-03-26 VITALS — BP 130/88 | Temp 98.2°F | Ht 62.5 in | Wt 164.0 lb

## 2016-03-26 DIAGNOSIS — Z0000 Encounter for general adult medical examination without abnormal findings: Secondary | ICD-10-CM | POA: Diagnosis not present

## 2016-03-26 DIAGNOSIS — I10 Essential (primary) hypertension: Secondary | ICD-10-CM

## 2016-03-26 DIAGNOSIS — Z79899 Other long term (current) drug therapy: Secondary | ICD-10-CM | POA: Diagnosis not present

## 2016-03-26 DIAGNOSIS — G4700 Insomnia, unspecified: Secondary | ICD-10-CM | POA: Diagnosis not present

## 2016-03-26 DIAGNOSIS — Z79890 Hormone replacement therapy (postmenopausal): Secondary | ICD-10-CM | POA: Diagnosis not present

## 2016-03-26 DIAGNOSIS — E782 Mixed hyperlipidemia: Secondary | ICD-10-CM

## 2016-03-26 MED ORDER — ESTRADIOL 0.075 MG/24HR TD PTWK: 0.0750 mg | MEDICATED_PATCH | TRANSDERMAL | 12 refills | Status: DC

## 2016-03-26 NOTE — Patient Instructions (Addendum)
Intensify lifestyle interventions.  Try off alcohol for a week and track   sleep  Headaches etc   Take blood pressure readings twice a day for 10 14 days and then periodically .To ensure below 140/90   .Send in readings    My chart.  Try decreasing etradiol  Lower dose . Marland Kitchen075  Avoid processed carbs     And continue exercise     Healthy lifestyle includes : At least 150 minutes of exercise weeks  , weight at healthy levels, which is usually   BMI 19-25. Avoid trans fats and processed foods;  Increase fresh fruits and veges to 5 servings per day. And avoid sweet beverages including tea and juice. Mediterranean diet with olive oil and nuts have been noted to be heart and brain healthy . Avoid tobacco products . Limit  alcohol to  7 per week for women and 14 servings for men.  Get adequate sleep . Wear seat belts . Don't text and drive .    Insomnia Insomnia is a sleep disorder that makes it difficult to fall asleep or to stay asleep. Insomnia can cause tiredness (fatigue), low energy, difficulty concentrating, mood swings, and poor performance at work or school. There are three different ways to classify insomnia:  Difficulty falling asleep.  Difficulty staying asleep.  Waking up too early in the morning. Any type of insomnia can be long-term (chronic) or short-term (acute). Both are common. Short-term insomnia usually lasts for three months or less. Chronic insomnia occurs at least three times a week for longer than three months. What are the causes? Insomnia may be caused by another condition, situation, or substance, such as:  Anxiety.  Certain medicines.  Gastroesophageal reflux disease (GERD) or other gastrointestinal conditions.  Asthma or other breathing conditions.  Restless legs syndrome, sleep apnea, or other sleep disorders.  Chronic pain.  Menopause. This may include hot flashes.  Stroke.  Abuse of alcohol, tobacco, or illegal  drugs.  Depression.  Caffeine.  Neurological disorders, such as Alzheimer disease.  An overactive thyroid (hyperthyroidism). The cause of insomnia may not be known. What increases the risk? Risk factors for insomnia include:  Gender. Women are more commonly affected than men.  Age. Insomnia is more common as you get older.  Stress. This may involve your professional or personal life.  Income. Insomnia is more common in people with lower income.  Lack of exercise.  Irregular work schedule or night shifts.  Traveling between different time zones. What are the signs or symptoms? If you have insomnia, trouble falling asleep or trouble staying asleep is the main symptom. This may lead to other symptoms, such as:  Feeling fatigued.  Feeling nervous about going to sleep.  Not feeling rested in the morning.  Having trouble concentrating.  Feeling irritable, anxious, or depressed. How is this treated? Treatment for insomnia depends on the cause. If your insomnia is caused by an underlying condition, treatment will focus on addressing the condition. Treatment may also include:  Medicines to help you sleep.  Counseling or therapy.  Lifestyle adjustments. Follow these instructions at home:  Take medicines only as directed by your health care provider.  Keep regular sleeping and waking hours. Avoid naps.  Keep a sleep diary to help you and your health care provider figure out what could be causing your insomnia. Include:  When you sleep.  When you wake up during the night.  How well you sleep.  How rested you feel the next day.  Any side effects of medicines you are taking.  What you eat and drink.  Make your bedroom a comfortable place where it is easy to fall asleep:  Put up shades or special blackout curtains to block light from outside.  Use a white noise machine to block noise.  Keep the temperature cool.  Exercise regularly as directed by your health  care provider. Avoid exercising right before bedtime.  Use relaxation techniques to manage stress. Ask your health care provider to suggest some techniques that may work well for you. These may include:  Breathing exercises.  Routines to release muscle tension.  Visualizing peaceful scenes.  Cut back on alcohol, caffeinated beverages, and cigarettes, especially close to bedtime. These can disrupt your sleep.  Do not overeat or eat spicy foods right before bedtime. This can lead to digestive discomfort that can make it hard for you to sleep.  Limit screen use before bedtime. This includes:  Watching TV.  Using your smartphone, tablet, and computer.  Stick to a routine. This can help you fall asleep faster. Try to do a quiet activity, brush your teeth, and go to bed at the same time each night.  Get out of bed if you are still awake after 15 minutes of trying to sleep. Keep the lights down, but try reading or doing a quiet activity. When you feel sleepy, go back to bed.  Make sure that you drive carefully. Avoid driving if you feel very sleepy.  Keep all follow-up appointments as directed by your health care provider. This is important. Contact a health care provider if:  You are tired throughout the day or have trouble in your daily routine due to sleepiness.  You continue to have sleep problems or your sleep problems get worse. Get help right away if:  You have serious thoughts about hurting yourself or someone else. This information is not intended to replace advice given to you by your health care provider. Make sure you discuss any questions you have with your health care provider. Document Released: 01/27/2000 Document Revised: 07/01/2015 Document Reviewed: 10/30/2013 Elsevier Interactive Patient Education  2017 [employer] Choices to Lower Your Triglycerides Triglycerides are a type of fat in your blood. High levels of triglycerides can increase the risk of heart  disease and stroke. If your triglyceride levels are high, the foods you eat and your eating habits are very important. Choosing the right foods can help lower your triglycerides. What general guidelines do I need to follow?  Lose weight if you are overweight.  Limit or avoid alcohol.  Fill one half of your plate with vegetables and green salads.  Limit fruit to two servings a day. Choose fruit instead of juice.  Make one fourth of your plate whole grains. Look for the word "whole" as the first word in the ingredient list.  Fill one fourth of your plate with lean protein foods.  Enjoy fatty fish (such as salmon, mackerel, sardines, and tuna) three times a week.  Choose healthy fats.  Limit foods high in starch and sugar.  Eat more home-cooked food and less restaurant, buffet, and fast food.  Limit fried foods.  Cook foods using methods other than frying.  Limit saturated fats.  Check ingredient lists to avoid foods with partially hydrogenated oils (trans fats) in them. What foods can I eat? Grains  Whole grains, such as whole wheat or whole grain breads, crackers, cereals, and pasta. Unsweetened oatmeal, bulgur, barley, quinoa, or brown rice. Corn  or whole wheat flour tortillas. Vegetables  Fresh or frozen vegetables (raw, steamed, roasted, or grilled). Green salads. Fruits  All fresh, canned (in natural juice), or frozen fruits. Meat and Other Protein Products  Ground beef (85% or leaner), grass-fed beef, or beef trimmed of fat. Skinless chicken or [place]. Ground chicken or [place]. Pork trimmed of fat. All fish and seafood. Eggs. Dried beans, peas, or lentils. Unsalted nuts or seeds. Unsalted canned or dry beans. Dairy  Low-fat dairy products, such as skim or 1% milk, 2% or reduced-fat cheeses, low-fat ricotta or cottage cheese, or plain low-fat yogurt. Fats and Oils  Tub margarines without trans fats. Light or reduced-fat mayonnaise and salad dressings. Avocado. Safflower,  olive, or canola oils. Natural peanut or almond butter. The items listed above may not be a complete list of recommended foods or beverages. Contact your dietitian for more options.  What foods are not recommended? Grains  White bread. White pasta. White rice. Cornbread. Bagels, pastries, and croissants. Crackers that contain trans fat. Vegetables  White potatoes. Corn. Creamed or fried vegetables. Vegetables in a cheese sauce. Fruits  Dried fruits. Canned fruit in light or heavy syrup. Fruit juice. Meat and Other Protein Products  Fatty cuts of meat. Ribs, chicken wings, bacon, sausage, bologna, salami, chitterlings, fatback, hot dogs, bratwurst, and packaged luncheon meats. Dairy  Whole or 2% milk, cream, half-and-half, and cream cheese. Whole-fat or sweetened yogurt. Full-fat cheeses. Nondairy creamers and whipped toppings. Processed cheese, cheese spreads, or cheese curds. Sweets and Desserts  Corn syrup, sugars, honey, and molasses. Candy. Jam and jelly. Syrup. Sweetened cereals. Cookies, pies, cakes, donuts, muffins, and ice cream. Fats and Oils  Butter, stick margarine, lard, shortening, ghee, or bacon fat. Coconut, palm kernel, or palm oils. Beverages  Alcohol. Sweetened drinks (such as sodas, lemonade, and fruit drinks or punches). The items listed above may not be a complete list of foods and beverages to avoid. Contact your dietitian for more information.  This information is not intended to replace advice given to you by your health care provider. Make sure you discuss any questions you have with your health care provider. Document Released: 11/17/2003 Document Revised: 07/07/2015 Document Reviewed: 12/03/2012 Elsevier Interactive Patient Education  2017 [employer].

## 2016-04-19 ENCOUNTER — Ambulatory Visit (INDEPENDENT_AMBULATORY_CARE_PROVIDER_SITE_OTHER): Payer: BLUE CROSS/BLUE SHIELD | Admitting: Internal Medicine

## 2016-04-19 ENCOUNTER — Ambulatory Visit (INDEPENDENT_AMBULATORY_CARE_PROVIDER_SITE_OTHER)
Admission: RE | Admit: 2016-04-19 | Discharge: 2016-04-19 | Disposition: A | Discharge status: Home / Self Care | Payer: BLUE CROSS/BLUE SHIELD | Source: Ambulatory Visit | Attending: Internal Medicine | Admitting: Internal Medicine

## 2016-04-19 ENCOUNTER — Encounter: Payer: Self-pay | Admitting: Internal Medicine

## 2016-04-19 VITALS — BP 140/80 | HR 85 | Temp 98.2°F | Ht 62.5 in | Wt 166.2 lb

## 2016-04-19 DIAGNOSIS — R0789 Other chest pain: Secondary | ICD-10-CM | POA: Diagnosis not present

## 2016-04-19 DIAGNOSIS — R1013 Epigastric pain: Secondary | ICD-10-CM

## 2016-04-19 DIAGNOSIS — R002 Palpitations: Secondary | ICD-10-CM

## 2016-04-19 MED ORDER — PANTOPRAZOLE SODIUM 40 MG PO TBEC: 40.0000 mg | DELAYED_RELEASE_TABLET | Freq: Every day | ORAL | 1 refills | Status: DC

## 2016-04-19 NOTE — Progress Notes (Signed)
Chief Complaint  Patient presents with  . Tachycardia    sharp pain on left side going across the chest   . Fatigue    HPI: Jacqueline Beasley 56 y.o.   sda appt for heart racing concerns  Seen 2 weeks ago for PV   But in retrospect not feeling that good in months   But then   Reading   A lot and worried about heart  Disease.  Awakening up  Racing  And  Breathing down .   dont feels good  Left  Pain some  And  hig epigastric pain  And hard to get comfortable.    Not exercise related .  Pin left mid pain  At times .  arfter eating  Voice raspy. Also . Marland Kitchen  Over the weekend  Stomach pain and  Heart beating hard . Non fever some light headed ness .    And arms felt funny .  Light headed.    ROS: See pertinent positives and negatives per HPI. No vomiting ocass acid in throat   No sob  No osa reported  No exercise related  Tachycardia  No fever chills weight loss change in exercise tolerance noted Neg fam hx afib arrythmia   Past Medical History:  Diagnosis Date  . Allergy    seasonal  . 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 Back pain    Recurrent has seen neurosurgery in past  . History of anal fissures    pt has itching with this at times  . Hx of varicella   . Hypertension     Family History  Problem Relation Age of Onset  . Cancer Mother     Uterus  . Stroke Mother     died 86   . Hypertension Mother   . Diabetes Maternal Grandmother   . Colon cancer Neg Hx     Social History   Social History  . Marital status: Married    Spouse name: N/A  . Number of children: N/A  . Years of education: N/A   Social History Main Topics  . Smoking status: Never Smoker  . Smokeless tobacco: Never Used  . Alcohol use 4.2 oz/week    7 Glasses of wine per week     Comment: 1 glass of wine a night  . Drug use: No  . Sexual activity: Not Asked   Other Topics Concern  . None   Social History Narrative   hh of 2-3  Son in college daughter ru grad not at home      2 dogs  No ets    1-2 per  day  etoh   Exercise 5 days per week.    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 Cetirizine HCl (ZYRTEC ALLERGY) 10 MG CAPS Take 10 mg by mouth as needed. Reported on 03/23/2015    . cholecalciferol (VITAMIN D) 1000 UNITS tablet Take 2,000 Units by mouth daily.    . diphenhydrAMINE (BENADRYL) 25 MG tablet Take 25 mg by mouth every 6 (six) hours as needed.    Marland Kitchen estradiol (CLIMARA) 0.075 mg/24hr patch Place 1 patch (0.075 mg total) onto the skin once a week. 4 patch 12  . fluticasone (FLONASE) 50 MCG/ACT nasal spray Place 2 sprays into  both nostrils daily.    Marland Kitchen lisinopril (PRINIVIL,ZESTRIL) 10 MG tablet TAKE 1 TABLET BY MOUTH EVERY DAY 90 tablet 0  . VITAMIN E PO Take by mouth.     No facility-administered medications prior to visit.      EXAM:  BP 140/80 (BP Location: Right Arm, Patient Position: Sitting, Cuff Size: Normal)   Pulse 85   Temp 98.2 F (36.8 C) (Oral)   Ht 5' 2.5" (1.588 m)   Wt 166 lb 3.2 oz (75.4 kg)   BMI 29.91 kg/m   Body mass index is 29.91 kg/m.  GENERAL: vitals reviewed and listed above, alert, oriented, appears well hydrated and in no acute distress ocass throat clearing  HEENT: atraumatic, conjunctiva  clear, no obvious abnormalities on inspection of external nose and ears OP : no lesion edema or exudate  NECK: no obvious masses on inspection palpation  LUNGS: clear to auscultation bilaterally, no wheezes, rales or rhonchi, good air movement Abdomen:  Sof,t normal bowel sounds without hepatosplenomegaly, no guarding rebound or masses no CVA tenderness CV: HRRR, no clubbing cyanosis or  peripheral edema nl cap refill  MS: moves all extremities without noticeable focal  abnormality  ekg nsr no acute changes  Nl intervals  Lab Results  Component Value Date   WBC 5.6 03/21/2016   HGB 13.2  03/21/2016   HCT 38.6 03/21/2016   PLT 341.0 03/21/2016   GLUCOSE 96 03/21/2016   CHOL 201 (H) 03/21/2016   TRIG 192.0 (H) 03/21/2016   HDL 56.90 03/21/2016   LDLDIRECT 149.9 03/03/2013   LDLCALC 106 (H) 03/21/2016   ALT 18 03/21/2016   AST 17 03/21/2016   NA 138 03/21/2016   K 4.6 03/21/2016   CL 102 03/21/2016   CREATININE 0.62 03/21/2016   BUN 11 03/21/2016   CO2 31 03/21/2016   TSH 3.14 03/21/2016   HGBA1C 5.4 09/13/2015   BP Readings from Last 3 Encounters:  04/19/16 140/80  03/26/16 130/88  09/20/15 130/78   Wt Readings from Last 3 Encounters:  04/19/16 166 lb 3.2 oz (75.4 kg)  03/26/16 164 lb (74.4 kg)  09/20/15 164 lb 12.8 oz (74.8 kg)    ASSESSMENT AND PLAN:  Discussed the following assessment and plan:  Intermittent left-sided chest pain - Plan: EKG 12-Lead, DG Chest 2 View, Ambulatory referral to Cardiology  Pounding heartbeat - Plan: EKG 12-Lead, DG Chest 2 View, Ambulatory referral to Cardiology  Epigastric abdominal pain episode - Plan: US Abdomen Complete No symptoms of obstructive sleep apnea consider reflux as a cause and with her episode of serious epigastric pain consider gallbladder disease. However she does have risk and left-sided chest pain intermittently check chest x-ray cardiology consult as to need for further workup of her pain. Begin PPI follow-up in a month earlier if needed. Reviewed findings with patient. Can readdress blood pressure readings at that point. -Patient advised to return or notify health care team  if symptoms worsen ,persist or new concerns arise.  Patient Instructions  This may be   A esophageal   Acid problem  But  We will  get cardiology to see you for opinion about cv poss causes. Of your symptoms   Will be contacted about referral appointment   ekg is normal  Get chest x ray  Begin acid blocker  As planned  consideration of getting an ultrasound of gall bladder also      Mariann Laster K. Panosh M.D.

## 2016-04-19 NOTE — Patient Instructions (Addendum)
This may be   A esophageal   Acid problem  But  We will  get cardiology to see you for opinion about cv poss causes. Of your symptoms   Will be contacted about referral appointment   ekg is normal  Get chest x ray  Begin acid blocker  As planned  consideration of getting an ultrasound of gall bladder also

## 2016-04-25 ENCOUNTER — Encounter: Payer: Self-pay | Admitting: Cardiology

## 2016-05-01 ENCOUNTER — Ambulatory Visit
Admission: RE | Admit: 2016-05-01 | Discharge: 2016-05-01 | Disposition: A | Discharge status: Home / Self Care | Payer: BLUE CROSS/BLUE SHIELD | Source: Ambulatory Visit | Attending: Internal Medicine | Admitting: Internal Medicine

## 2016-05-01 DIAGNOSIS — R1013 Epigastric pain: Secondary | ICD-10-CM

## 2016-05-03 NOTE — Progress Notes (Signed)
Cardiology Office Note   Date:  05/06/2016   ID:  Jacqueline Beasley, Jacqueline Beasley 02-04-1961, MRN [id number]  PCP:  Lottie Dawson, MD  Cardiologist:   Minus Breeding, MD  Referring:  Lottie Dawson, MD  Chief Complaint  Patient presents with  . Edema  . Dizziness      History of Present Illness: Jacqueline Beasley is a 55 y.o. female who presents for evaluation of chest pain.  The patient reports no past cardiac history other than a nuclear stress test years ago. For several months she's been getting discomfort that is mostly between the shoulder blades. She'll also have a dull chest discomfort. At times her discomfort can be significant. She's had some abdominal or upper epigastric discomfort along with this chest discomfort that has been 10 out of 10 in the past. Seems to be worse lying flat. It's not associated with activity that happens at rest or with activity. She walks dogs up and down hills and her discomfort is not particularly worse with this. It doesn't seem to be associated with shortness of breath although she's had some nausea with it. She's not describing neck or arm discomfort. She did start protonic some seemed to help somewhat.    Past Medical History:  Diagnosis Date  . Allergy    seasonal  . 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 Back pain    Recurrent has seen neurosurgery in past  . History of anal fissures    pt has itching with this at times  . Hx of varicella   . Hypertension     Past Surgical History:  Procedure Laterality Date  . ABDOMINAL HYSTERECTOMY     total with bso  for fibroids.   Marland Kitchen BREAST ENHANCEMENT SURGERY  2010  . CESAREAN SECTION  [id number]     Current Outpatient Prescriptions  Medication Sig Dispense Refill  . Cetirizine HCl (ZYRTEC ALLERGY) 10 MG CAPS Take 10 mg by mouth as needed. Reported on 03/23/2015    . cholecalciferol (VITAMIN D) 1000 UNITS tablet Take 2,000 Units by mouth daily.    . diphenhydrAMINE (BENADRYL) 25 MG tablet Take 25  mg by mouth every 6 (six) hours as needed.    Marland Kitchen estradiol (CLIMARA) 0.075 mg/24hr patch Place 1 patch (0.075 mg total) onto the skin once a week. 4 patch 12  . fluticasone (FLONASE) 50 MCG/ACT nasal spray Place 2 sprays into both nostrils daily.    Marland Kitchen lisinopril (PRINIVIL,ZESTRIL) 20 MG tablet Take 1 tablet (20 mg total) by mouth daily. 90 tablet 3  . pantoprazole (PROTONIX) 40 MG tablet Take 1 tablet (40 mg total) by mouth daily. 30 tablet 1  . VITAMIN E PO Take by mouth.     No current facility-administered medications for this visit.     Allergies:   Ambien [zolpidem]    Social History:  The patient  reports that she has never smoked. She has never used smokeless tobacco. She reports that she drinks about 4.2 oz of alcohol per week . She reports that she does not use drugs.   Family History:  The patient's family history includes Cancer in her mother; Diabetes in her maternal grandmother; Hypertension in her mother and sister; Stroke in her mother.    ROS:  Please see the history of present illness.   Otherwise, review of systems are positive for none.   All other systems are reviewed and negative.    PHYSICAL EXAM: VS:  BP 140/88  Pulse 73   Ht 5\' 2"  (1.575 m)   Wt 168 lb 12.8 oz (76.6 kg)   BMI 30.87 kg/m  , BMI Body mass index is 30.87 kg/m. GENERAL:  Well appearing HEENT:  Pupils equal round and reactive, fundi not visualized, oral mucosa unremarkable NECK:  No jugular venous distention, waveform within normal limits, carotid upstroke brisk and symmetric, no bruits, no thyromegaly LYMPHATICS:  No cervical, inguinal adenopathy LUNGS:  Clear to auscultation bilaterally BACK:  No CVA tenderness CHEST:  Unremarkable HEART:  PMI not displaced or sustained,S1 and S2 within normal limits, no S3, no S4, no clicks, no rubs, no murmurs ABD:  Flat, positive bowel sounds normal in frequency in pitch, no bruits, no rebound, no guarding, no midline pulsatile mass, no hepatomegaly, no  splenomegaly EXT:  2 plus pulses throughout, no edema, no cyanosis no clubbing SKIN:  No rashes no nodules NEURO:  Cranial nerves II through XII grossly intact, motor grossly intact throughout PSYCH:  Cognitively intact, oriented to person place and time    EKG:  EKG is not ordered today. The ekg ordered 04/19/16 demonstrates sinus rhythm, rate 79, axis within normal limits, intervals within normal limits, no acute ST-T wave changes.   Recent Labs: 03/21/2016: ALT 18; BUN 11; Creatinine, Ser 0.62; Hemoglobin 13.2; Platelets 341.0; Potassium 4.6; Sodium 138; TSH 3.14    Lipid Panel    Component Value Date/Time   CHOL 201 (H) 03/21/2016 0802   TRIG 192.0 (H) 03/21/2016 0802   HDL 56.90 03/21/2016 0802   CHOLHDL 4 03/21/2016 0802   VLDL 38.4 03/21/2016 0802   LDLCALC 106 (H) 03/21/2016 0802   LDLDIRECT 149.9 03/03/2013 1106      Wt Readings from Last 3 Encounters:  05/04/16 168 lb 12.8 oz (76.6 kg)  04/19/16 166 lb 3.2 oz (75.4 kg)  03/26/16 164 lb (74.4 kg)      Other studies Reviewed: Additional studies/ records that were reviewed today include: Office records, labs. Review of the above records demonstrates:  Please see elsewhere in the note.     ASSESSMENT AND PLAN:  CHEST  DISCOMFORT:   I think the pretest probability of obstructive coronary disease and risk factors. Therefore, she is going to have a POET (Plain Old Exercise Treadmill) and a coronary calcium score.   DYSLIPIDEMIA:  We talked briefly about this and  I suggested the Mediterranean diet.   HTN:  She will increase the lisinopril to 20 mg daily as she is not at target BP frequently with her pressures at home.     Current medicines are reviewed at length with the patient today.  The patient does not have concerns regarding medicines.  The following changes have been made:  As above.  Labs/ tests ordered today include:   Orders Placed This Encounter  Procedures  . CT CARDIAC SCORING  . EXERCISE  TOLERANCE TEST     Disposition:   FU with me as needed.     Signed, Minus Breeding, MD  05/06/2016 8:20 PM    [facility]

## 2016-05-04 ENCOUNTER — Encounter: Payer: Self-pay | Admitting: Cardiology

## 2016-05-04 ENCOUNTER — Ambulatory Visit (INDEPENDENT_AMBULATORY_CARE_PROVIDER_SITE_OTHER): Payer: BLUE CROSS/BLUE SHIELD | Admitting: Cardiology

## 2016-05-04 VITALS — BP 140/88 | HR 73 | Ht 62.0 in | Wt 168.8 lb

## 2016-05-04 DIAGNOSIS — I10 Essential (primary) hypertension: Secondary | ICD-10-CM | POA: Diagnosis not present

## 2016-05-04 DIAGNOSIS — E785 Hyperlipidemia, unspecified: Secondary | ICD-10-CM

## 2016-05-04 DIAGNOSIS — R079 Chest pain, unspecified: Secondary | ICD-10-CM

## 2016-05-04 MED ORDER — LISINOPRIL 20 MG PO TABS: 20.0000 mg | ORAL_TABLET | Freq: Every day | ORAL | 3 refills | Status: DC

## 2016-05-04 NOTE — Patient Instructions (Addendum)
Medication Instructions:  INCREASE- Lisinopril 20 mg daily   Labwork: None Ordered  Testing/Procedures: Your physician has requested that you have an exercise tolerance test. For further information please visit [email or web address]. Please also follow instruction sheet, as given.  Your physician has requested that you have a Coronary Calcium Score. This test is done at our [facility]  Follow-Up: Your physician recommends that you schedule a follow-up appointment in: As Needed   Any Other Special Instructions Will Be Listed Below (If Applicable).   If you need a refill on your cardiac medications before your next appointment, please call your pharmacy.

## 2016-05-06 ENCOUNTER — Encounter: Payer: Self-pay | Admitting: Cardiology

## 2016-05-21 ENCOUNTER — Ambulatory Visit: Payer: BLUE CROSS/BLUE SHIELD | Admitting: Internal Medicine

## 2016-05-22 ENCOUNTER — Telehealth (HOSPITAL_COMMUNITY): Payer: Self-pay

## 2016-05-22 NOTE — Telephone Encounter (Signed)
Encounter complete. 

## 2016-05-24 ENCOUNTER — Ambulatory Visit (HOSPITAL_COMMUNITY)
Admission: RE | Admit: 2016-05-24 | Discharge: 2016-05-24 | Disposition: A | Discharge status: Home / Self Care | Payer: BLUE CROSS/BLUE SHIELD | Source: Ambulatory Visit | Attending: Cardiovascular Disease | Admitting: Cardiovascular Disease

## 2016-05-24 ENCOUNTER — Ambulatory Visit (INDEPENDENT_AMBULATORY_CARE_PROVIDER_SITE_OTHER)
Admission: RE | Admit: 2016-05-24 | Discharge: 2016-05-24 | Disposition: A | Discharge status: Home / Self Care | Payer: Self-pay | Source: Ambulatory Visit | Attending: Cardiology | Admitting: Cardiology

## 2016-05-24 DIAGNOSIS — R079 Chest pain, unspecified: Secondary | ICD-10-CM

## 2016-05-24 LAB — EXERCISE TOLERANCE TEST
CSEPED: 11 min
CSEPEW: 13.3 METS
Exercise duration (sec): 1 s
MPHR: 167 {beats}/min
Peak HR: 169 {beats}/min
Percent HR: 101 %
RPE: 18
Rest HR: 81 {beats}/min

## 2016-05-30 NOTE — Progress Notes (Signed)
Cardiology Office Note   Date:  06/01/2016   ID:  Jacqueline, Beasley 03-Dec-1960, MRN [id number]  PCP:  Lottie Dawson, MD  Cardiologist:   Minus Breeding, MD  Referring:  Lottie Dawson, MD  Chief Complaint  Patient presents with  . Hypertension      History of Present Illness: Jacqueline Beasley is a 56 y.o. female who presents for evaluation of chest pain.  At the first appt I performed a POET (Plain Old Exercise Treadmill).  She exercised for 11 minutes without any ischemic ST T wave changes.  She returns for follow up.  She did have increased coronary calcium as well.  She reports that since taking a higher dose of lisinopril she has felt well. Her BP has been well controlled.  The patient denies any new symptoms such as chest discomfort, neck or arm discomfort. There has been no new shortness of breath, PND or orthopnea. There have been no reported palpitations, presyncope or syncope.   Past Medical History:  Diagnosis Date  . Allergy    seasonal  . 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 Back pain    Recurrent has seen neurosurgery in past  . History of anal fissures    pt has itching with this at times  . Hx of varicella   . Hypertension     Past Surgical History:  Procedure Laterality Date  . ABDOMINAL HYSTERECTOMY     total with bso  for fibroids.   Marland Kitchen BREAST ENHANCEMENT SURGERY  2010  . CESAREAN SECTION  [id number]     Current Outpatient Prescriptions  Medication Sig Dispense Refill  . Cetirizine HCl (ZYRTEC ALLERGY) 10 MG CAPS Take 10 mg by mouth as needed. Reported on 03/23/2015    . cholecalciferol (VITAMIN D) 1000 UNITS tablet Take 2,000 Units by mouth daily.    . diphenhydrAMINE (BENADRYL) 25 MG tablet Take 25 mg by mouth every 6 (six) hours as needed.    Marland Kitchen estradiol (CLIMARA) 0.075 mg/24hr patch Place 1 patch (0.075 mg total) onto the skin once a week. 4 patch 12  . fluticasone (FLONASE) 50 MCG/ACT nasal spray Place 2 sprays into both nostrils daily.    Marland Kitchen  lisinopril (PRINIVIL,ZESTRIL) 20 MG tablet Take 1 tablet (20 mg total) by mouth daily. 90 tablet 3  . pantoprazole (PROTONIX) 40 MG tablet Take 1 tablet (40 mg total) by mouth daily. 30 tablet 1  . VITAMIN E PO Take by mouth.    . pravastatin (PRAVACHOL) 40 MG tablet Take 1 tablet (40 mg total) by mouth every evening. 30 tablet 9   No current facility-administered medications for this visit.     Allergies:   Ambien [zolpidem]    ROS:  Please see the history of present illness.   Otherwise, review of systems are positive for none.   All other systems are reviewed and negative.    PHYSICAL EXAM: VS:  BP (!) 130/94   Pulse 80   Ht 5\' 2"  (1.575 m)   Wt 168 lb (76.2 kg)   BMI 30.73 kg/m  , BMI Body mass index is 30.73 kg/m. GENERAL:  Well appearing NECK:  No jugular venous distention, waveform within normal limits, carotid upstroke brisk and symmetric, no bruits, no thyromegaly LUNGS:  Clear to auscultation bilaterally BACK:  No CVA tenderness CHEST:  Unremarkable HEART:  PMI not displaced or sustained,S1 and S2 within normal limits, no S3, no S4, no clicks, no rubs, no murmurs ABD:  Flat, positive bowel sounds normal in frequency in pitch, no bruits, no rebound, no guarding, no midline pulsatile mass, no hepatomegaly, no splenomegaly EXT:  2 plus pulses throughout, no edema, no cyanosis no clubbing   EKG:  EKG is not  ordered today.   Recent Labs: 03/21/2016: ALT 18; BUN 11; Creatinine, Ser 0.62; Hemoglobin 13.2; Platelets 341.0; Potassium 4.6; Sodium 138; TSH 3.14    Lipid Panel    Component Value Date/Time   CHOL 201 (H) 03/21/2016 0802   TRIG 192.0 (H) 03/21/2016 0802   HDL 56.90 03/21/2016 0802   CHOLHDL 4 03/21/2016 0802   VLDL 38.4 03/21/2016 0802   LDLCALC 106 (H) 03/21/2016 0802   LDLDIRECT 149.9 03/03/2013 1106      Wt Readings from Last 3 Encounters:  05/31/16 168 lb (76.2 kg)  05/04/16 168 lb 12.8 oz (76.6 kg)  04/19/16 166 lb 3.2 oz (75.4 kg)       Other studies Reviewed: Additional studies/ records that were reviewed today include: POET (Plain Old Exercise Treadmill) Review of the above records demonstrates:  Please see elsewhere in the note.     ASSESSMENT AND PLAN:  ELEVATED CORONARY CALCIUM:   We discussed at great length again today risk reduction.  I will plan a POET (Plain Old Exercise Treadmill) again next year.    DYSLIPIDEMIA:  I suggested the Mediterranean diet.  We had a long discussion about the risk benefits of statin.  She will start Pravachol 40 mg daily understanding that this is outside of current guidelines.    HTN:  She will continue the lisinopril to 20 mg daily and she will keep a BP diary.     Current medicines are reviewed at length with the patient today.  The patient does not have concerns regarding medicines.  The following changes have been made:  As above.  Labs/ tests ordered today include:  None  Orders Placed This Encounter  Procedures  . Lipid panel  . EXERCISE TOLERANCE TEST     Disposition:   FU with me as needed.     Signed, Minus Breeding, MD  06/01/2016 11:13 PM    [facility]

## 2016-05-31 ENCOUNTER — Ambulatory Visit (INDEPENDENT_AMBULATORY_CARE_PROVIDER_SITE_OTHER): Payer: BLUE CROSS/BLUE SHIELD | Admitting: Cardiology

## 2016-05-31 VITALS — BP 130/94 | HR 80 | Ht 62.0 in | Wt 168.0 lb

## 2016-05-31 DIAGNOSIS — R931 Abnormal findings on diagnostic imaging of heart and coronary circulation: Secondary | ICD-10-CM | POA: Diagnosis not present

## 2016-05-31 DIAGNOSIS — E785 Hyperlipidemia, unspecified: Secondary | ICD-10-CM

## 2016-05-31 DIAGNOSIS — I10 Essential (primary) hypertension: Secondary | ICD-10-CM

## 2016-05-31 MED ORDER — PRAVASTATIN SODIUM 40 MG PO TABS: 40.0000 mg | ORAL_TABLET | Freq: Every evening | ORAL | 9 refills | Status: DC

## 2016-05-31 NOTE — Patient Instructions (Signed)
Medication Instructions:  START- Pravastatin 40 mg daily  Labwork: Fasting Lipids in 8 weeks  Testing/Procedures: Your physician has requested that you have an exercise tolerance test in 1 Year. For further information please visit [email or web address]. Please also follow instruction sheet, as given.   Follow-Up: Your physician wants you to follow-up in: 1 Year. You will receive a reminder letter in the mail two months in advance. If you don't receive a letter, please call our office to schedule the follow-up appointment.   Any Other Special Instructions Will Be Listed Below (If Applicable).   If you need a refill on your cardiac medications before your next appointment, please call your pharmacy.

## 2016-06-01 ENCOUNTER — Encounter: Payer: Self-pay | Admitting: Cardiology

## 2016-06-12 ENCOUNTER — Other Ambulatory Visit: Payer: Self-pay | Admitting: Internal Medicine

## 2016-07-25 ENCOUNTER — Other Ambulatory Visit: Payer: Self-pay

## 2016-07-25 MED ORDER — LISINOPRIL 20 MG PO TABS: 20.0000 mg | ORAL_TABLET | Freq: Every day | ORAL | 3 refills | Status: DC

## 2016-10-09 NOTE — Progress Notes (Signed)
   No chief complaint on file.   HPI: Jacqueline Beasley 56 y.o.  appt was made but pt wants lab only as per fu dr H cardiology for lipid panel  And declined OV.  Ok to do lab only and results to forward   and management  Medication etc per cardiology .

## 2016-10-11 ENCOUNTER — Other Ambulatory Visit: Payer: Self-pay

## 2016-10-11 ENCOUNTER — Encounter: Payer: BLUE CROSS/BLUE SHIELD | Admitting: Internal Medicine

## 2016-10-11 ENCOUNTER — Other Ambulatory Visit (INDEPENDENT_AMBULATORY_CARE_PROVIDER_SITE_OTHER): Payer: BLUE CROSS/BLUE SHIELD

## 2016-10-11 DIAGNOSIS — E782 Mixed hyperlipidemia: Secondary | ICD-10-CM

## 2016-10-11 DIAGNOSIS — I10 Essential (primary) hypertension: Secondary | ICD-10-CM | POA: Diagnosis not present

## 2016-10-11 DIAGNOSIS — Z79899 Other long term (current) drug therapy: Secondary | ICD-10-CM

## 2016-10-11 LAB — LIPID PANEL
Cholesterol: 182 mg/dL (ref 0–200)
HDL: 64.3 mg/dL (ref >39.00)
LDL Cholesterol: 96 mg/dL (ref 0–99)
NONHDL: 117.55
TRIGLYCERIDES: 106 mg/dL (ref 0.0–149.0)
Total CHOL/HDL Ratio: 3
VLDL: 21.2 mg/dL (ref 0.0–40.0)

## 2016-10-17 ENCOUNTER — Ambulatory Visit: Payer: BLUE CROSS/BLUE SHIELD | Admitting: Internal Medicine

## 2016-10-17 ENCOUNTER — Other Ambulatory Visit: Payer: BLUE CROSS/BLUE SHIELD | Admitting: Internal Medicine

## 2016-10-18 ENCOUNTER — Telehealth: Payer: Self-pay | Admitting: *Deleted

## 2016-10-18 DIAGNOSIS — E785 Hyperlipidemia, unspecified: Secondary | ICD-10-CM

## 2016-10-18 MED ORDER — PRAVASTATIN SODIUM 80 MG PO TABS: 80.0000 mg | ORAL_TABLET | Freq: Every evening | ORAL | 2 refills | Status: DC

## 2016-10-18 NOTE — Telephone Encounter (Signed)
-----   Message from Minus Breeding, MD sent at 10/13/2016  4:26 PM EDT ----- LDL better but still elevated.  Increase Pravastatin to 80 mg po daily.  Repeat lipid in 8 weeks.  Call Ms. Hassell Done with the results and send results to Panosh, Standley Brooking, MD

## 2016-10-18 NOTE — Telephone Encounter (Signed)
Pt aware of her blood work, pravastatin change to 80 mg and send into pt pharmacy, fasting lipids ordered and mail to pt to get done in 8 weeks.

## 2016-11-02 ENCOUNTER — Encounter: Payer: Self-pay | Admitting: Internal Medicine

## 2016-11-02 ENCOUNTER — Encounter: Payer: Self-pay | Admitting: Cardiology

## 2016-11-22 ENCOUNTER — Other Ambulatory Visit: Payer: Self-pay | Admitting: Internal Medicine

## 2016-11-22 DIAGNOSIS — Z1231 Encounter for screening mammogram for malignant neoplasm of breast: Secondary | ICD-10-CM

## 2016-12-04 ENCOUNTER — Other Ambulatory Visit: Payer: Self-pay | Admitting: Internal Medicine

## 2016-12-12 ENCOUNTER — Ambulatory Visit: Payer: BLUE CROSS/BLUE SHIELD

## 2016-12-13 ENCOUNTER — Encounter: Payer: Self-pay | Admitting: Internal Medicine

## 2016-12-25 ENCOUNTER — Ambulatory Visit
Admission: RE | Admit: 2016-12-25 | Discharge: 2016-12-25 | Disposition: A | Discharge status: Home / Self Care | Payer: BLUE CROSS/BLUE SHIELD | Source: Ambulatory Visit | Attending: Internal Medicine | Admitting: Internal Medicine

## 2016-12-25 DIAGNOSIS — Z1231 Encounter for screening mammogram for malignant neoplasm of breast: Secondary | ICD-10-CM

## 2017-02-06 ENCOUNTER — Other Ambulatory Visit: Payer: Self-pay | Admitting: Emergency Medicine

## 2017-02-06 MED ORDER — PANTOPRAZOLE SODIUM 40 MG PO TBEC: 40.0000 mg | DELAYED_RELEASE_TABLET | Freq: Every day | ORAL | 0 refills | Status: DC

## 2017-02-22 ENCOUNTER — Encounter: Payer: Self-pay | Admitting: Internal Medicine

## 2017-02-22 MED ORDER — ESTRADIOL 0.075 MG/24HR TD PTWK: 0.0750 mg | MEDICATED_PATCH | TRANSDERMAL | 1 refills | Status: DC

## 2017-03-20 ENCOUNTER — Other Ambulatory Visit: Payer: BLUE CROSS/BLUE SHIELD

## 2017-03-24 NOTE — Progress Notes (Signed)
Chief Complaint  Patient presents with  . Annual Exam    Cyst in left hand now bothersome causing issue with thumb.     HPI: Patient  Jacqueline Beasley  57 y.o. comes in today for Preventive Health Care visit  And med management    bp :doing ok     taking   Lisinopril  Inc to 20 mg     To see dr Lemmie Evens in April   HLD  Taking  40 mg    Pravachol.  Had se of 80 mg   protonix   Every day .  Helpful   hrt on 75 hto all the time anyway notsee any difference indec dose  utd on mammo  Now had fit bit and  Helping track sleep and  Exercise  Father  ? alzheimer ? s   Health Maintenance  Topic Date Due  . Hepatitis C Screening  Sep 22, 1960  . HIV Screening  05/28/1977  . INFLUENZA VACCINE  11/27/2017 (Originally 09/12/2016)  . MAMMOGRAM  12/26/2018  . COLONOSCOPY  04/04/2023  . 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 gramig in past and said to come back if an issue GEN/ HEENT: No fever, significant weight changes sweats headaches vision problems hearing changes, CV/ PULM; No chest pain shortness of breath cough, syncope,edema  change in exercise tolerance. GI /GU: No adominal pain, vomiting, change in bowel habits. No blood in the stool. No significant GU symptoms. SKIN/HEME: ,no acute skin rashes suspicious lesions or bleeding. No lymphadenopathy, nodules, masses.  NEURO/ PSYCH:  No neurologic signs such as weakness numbness. No depression anxiety. IMM/ Allergy: No unusual infections.  Allergy .   REST of 12 system review negative except as per HPI   Past Medical History:  Diagnosis Date  . Allergy    seasonal  . 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 Back pain    Recurrent has seen neurosurgery in  past  . History of anal fissures    pt has itching with this at times  . Hx of varicella   . Hypertension     Past Surgical History:  Procedure Laterality Date  . ABDOMINAL HYSTERECTOMY     total with bso  for fibroids.   . AUGMENTATION MAMMAPLASTY    . BREAST ENHANCEMENT SURGERY  2010  . CESAREAN SECTION  [id number]    Family History  Problem Relation Age of Onset  . Cancer Mother        Uterus  . Stroke Mother        died 86   . Hypertension Mother   . Diabetes Maternal Grandmother   . Hypertension Sister   . Colon cancer Neg Hx     Social History   Socioeconomic History  . Marital status: Married    Spouse name: None  . Number of children: 2  . Years of education: None  . Highest education level: None  Social Needs  . Financial resource strain: None  . Food insecurity - worry: None  .  Food insecurity - inability: None  . Transportation needs - medical: None  . Transportation needs - non-medical: None  Occupational History  . None  Tobacco Use  . Smoking status: Never Smoker  . Smokeless tobacco: Never Used  Substance and Sexual Activity  . Alcohol use: Yes    Alcohol/week: 4.2 oz    Types: 7 Glasses of wine per week    Comment: 1 glass of wine a night  . Drug use: No  . Sexual activity: None  Other Topics Concern  . None  Social History Narrative   Lives at home with husband.   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 Cetirizine HCl (ZYRTEC ALLERGY) 10 MG CAPS Take 10 mg by mouth as needed. Reported on 03/23/2015    . cholecalciferol (VITAMIN D) 1000 UNITS tablet Take 2,000 Units by mouth daily.    . diphenhydrAMINE (BENADRYL) 25 MG tablet Take 25 mg by mouth every 6 (six) hours as needed.    Marland Kitchen estradiol (CLIMARA) 0.075 mg/24hr patch Place 1 patch (0.075 mg total) onto the skin once a week.  4 patch 1  . fluticasone (FLONASE) 50 MCG/ACT nasal spray Place 2 sprays into both nostrils daily.    Marland Kitchen lisinopril (PRINIVIL,ZESTRIL) 20 MG tablet Take 1 tablet (20 mg total) by mouth daily. 90 tablet 3  . pantoprazole (PROTONIX) 40 MG tablet Take 1 tablet (40 mg total) by mouth daily. 90 tablet 0  . pravastatin (PRAVACHOL) 80 MG tablet Take 1 tablet (80 mg total) by mouth every evening. 90 tablet 2  . VITAMIN E PO Take by mouth.     No facility-administered medications prior to visit.      EXAM:  BP 124/82 (BP Location: Right Arm, Patient Position: Sitting, Cuff Size: Normal)   Pulse 71   Temp 98 F (36.7 C) (Oral)   Ht '5\' 2"'$  (1.575 m)   Wt 167 lb 4.8 oz (75.9 kg)   BMI 30.60 kg/m   Body mass index is 30.6 kg/m. Wt Readings from Last 3 Encounters:  03/27/17 167 lb 4.8 oz (75.9 kg)  05/31/16 168 lb (76.2 kg)  05/04/16 168 lb 12.8 oz (76.6 kg)    Physical Exam: Vital signs reviewed GLO:VFIE is a well-developed well-nourished alert cooperative    who appearsr stated age in no acute distress.  HEENT: normocephalic atraumatic , Eyes: PERRL EOM's full, conjunctiva clear, Nares: paten,t no deformity discharge or tenderness., Ears: no deformity EAC's clear TMs with normal landmarks. Mouth: clear OP, no lesions, edema.  Moist mucous membranes. Dentition in adequate repair. NECK: supple without masses, thyromegaly or bruits. CHEST/PULM:  Clear to auscultation and percussion breath sounds equal no wheeze , rales or rhonchi. No chest wall deformities or tenderness. Breast: normal by inspection . No dimpling, discharge, masses, tenderness or discharge . Implants   CV: PMI is nondisplaced, S1 S2 no gallops, murmurs, rubs. Peripheral pulses are full without delay.No JVD .  ABDOMEN: Bowel sounds normal nontender  No guard or rebound, no hepato splenomegal no CVA tenderness.   Extremtities:  No clubbing cyanosis or edema, no acute joint swelling or redness no focal atrophy left thumb base   Cystic lesion  rom nl  NEURO:  Oriented x3, cranial nerves 3-12 appear to be intact, no obvious focal weakness,gait within normal limits  no abnormal reflexes or asymmetrical SKIN: No acute rashes normal turgor, color, no bruising or petechiae. PSYCH: Oriented, good eye contact, no obvious depression anxiety, cognition and judgment appear normal. LN: no cervical axillary iadenopathy  Lab Results  Component Value Date   WBC 5.6 03/21/2016   HGB 13.2 03/21/2016   HCT 38.6 03/21/2016   PLT 341.0 03/21/2016   GLUCOSE 96 03/21/2016   CHOL 182 10/11/2016   TRIG 106.0 10/11/2016   HDL 64.30 10/11/2016   LDLDIRECT 149.9 03/03/2013   LDLCALC 96 10/11/2016   ALT 18 03/21/2016   AST 17 03/21/2016   NA 138 03/21/2016   K 4.6 03/21/2016   CL 102 03/21/2016   CREATININE 0.62 03/21/2016   BUN 11 03/21/2016   CO2 31 03/21/2016   TSH 3.14 03/21/2016   HGBA1C 5.4 09/13/2015    BP Readings from Last 3 Encounters:  03/27/17 124/82  05/31/16 (!) 130/94  05/04/16 140/88   Wt Readings from Last 3 Encounters:  03/27/17 167 lb 4.8 oz (75.9 kg)  05/31/16 168 lb (76.2 kg)  05/04/16 168 lb 12.8 oz (76.6 kg)     Lab results reviewed with patient   ASSESSMENT AND PLAN:  Discussed the following assessment and plan:  Visit for preventive health examination - Plan: Basic metabolic panel, CBC with Differential/Platelet, Hepatic function panel, Lipid panel, TSH, Hemoglobin A1c  Medication management - Plan: Basic metabolic panel, CBC with Differential/Platelet, Hepatic function panel, Lipid panel, TSH, Hemoglobin A1c  Essential hypertension - Plan: Basic metabolic panel, CBC with Differential/Platelet, Hepatic function panel, Lipid panel, TSH, Hemoglobin A1c  Fasting hyperglycemia - Plan: Basic metabolic panel, CBC with Differential/Platelet, Hepatic function panel, Lipid panel, TSH, Hemoglobin A1c  Hyperlipidemia, unspecified hyperlipidemia type - Plan: Basic metabolic panel, CBC with  Differential/Platelet, Hepatic function panel, Lipid panel, TSH, Hemoglobin A1c  Postmenopausal HRT (hormone replacement therapy) - try toe xtend  patch as tolerated   Influenza vaccination declined by patient   See  Hand  Person  Again gramig.  Patient Care Team: Panosh, Standley Brooking, MD as PCP - General (Internal Medicine) Karie Chimera, MD as Consulting Physician (Neurosurgery) Patient Instructions  Clotilde Dieter are doing well.   Try extending the  hrt patch  To 10 days or so  If tolerated then we can     Try decreasing strength of   HRT    At some point.   Call [facility] about fu hand ? Dr Amedeo Plenty? Will notify you  of labs when available.   Health Maintenance, Female Adopting a healthy lifestyle and getting preventive care can go a long way to promote health and wellness. Talk with your health care provider about what schedule of regular examinations is right for you. This is a good chance for you to check in with your provider about disease prevention and staying healthy. In between checkups, there are plenty of things you can do on your own. Experts have done a lot of research about which lifestyle changes and preventive measures are most likely to keep you healthy. Ask your health care provider for more information. Weight and diet Eat a healthy diet  Be sure to include plenty of vegetables, fruits, low-fat dairy products, and lean protein.  Do not eat a lot of foods high in solid fats, added sugars, or salt.  Get regular exercise. This is one of the most important things you can do for your health. ? Most adults should exercise for at least 150 minutes each week. The  exercise should increase your heart rate and make you sweat (moderate-intensity exercise). ? Most adults should also do strengthening exercises at least twice a week. This is in addition to the moderate-intensity exercise.  Maintain a healthy weight  Body mass index (BMI) is a measurement that can be used to  identify possible weight problems. It estimates body fat based on height and weight. Your health care provider can help determine your BMI and help you achieve or maintain a healthy weight.  For females 25 years of age and older: ? A BMI below 18.5 is considered underweight. ? A BMI of 18.5 to 24.9 is normal. ? A BMI of 25 to 29.9 is considered overweight. ? A BMI of 30 and above is considered obese.  Watch levels of cholesterol and blood lipids  You should start having your blood tested for lipids and cholesterol at 55 years of age, then have this test every 5 years.  You may need to have your cholesterol levels checked more often if: ? Your lipid or cholesterol levels are high. ? You are older than 55 years of age. ? You are at high risk for heart disease.  Cancer screening Lung Cancer  Lung cancer screening is recommended for adults 55-82 years old who are at high risk for lung cancer because of a history of smoking.  A yearly low-dose CT scan of the lungs is recommended for people who: ? Currently smoke. ? Have quit within the past 15 years. ? Have at least a 30-pack-year history of smoking. A pack year is smoking an average of one pack of cigarettes a day for 1 year.  Yearly screening should continue until it has been 15 years since you quit.  Yearly screening should stop if you develop a health problem that would prevent you from having lung cancer treatment.  Breast Cancer  Practice breast self-awareness. This means understanding how your breasts normally appear and feel.  It also means doing regular breast self-exams. Let your health care provider know about any changes, no matter how small.  If you are in your 20s or 30s, you should have a clinical breast exam (CBE) by a health care provider every 1-3 years as part of a regular health exam.  If you are 22 or older, have a CBE every year. Also consider having a breast X-ray (mammogram) every year.  If you have a  family history of breast cancer, talk to your health care provider about genetic screening.  If you are at high risk for breast cancer, talk to your health care provider about having an MRI and a mammogram every year.  Breast cancer gene (BRCA) assessment is recommended for women who have family members with BRCA-related cancers. BRCA-related cancers include: ? Breast. ? Ovarian. ? Tubal. ? Peritoneal cancers.  Results of the assessment will determine the need for genetic counseling and BRCA1 and BRCA2 testing.  Cervical Cancer Your health care provider may recommend that you be screened regularly for cancer of the pelvic organs (ovaries, uterus, and vagina). This screening involves a pelvic examination, including checking for microscopic changes to the surface of your cervix (Pap test). You may be encouraged to have this screening done every 3 years, beginning at age 23.  For women ages 21-65, health care providers may recommend pelvic exams and Pap testing every 3 years, or they may recommend the Pap and pelvic exam, combined with testing for human papilloma virus (HPV), every 5 years. Some types of HPV increase your  risk of cervical cancer. Testing for HPV may also be done on women of any age with unclear Pap test results.  Other health care providers may not recommend any screening for nonpregnant women who are considered low risk for pelvic cancer and who do not have symptoms. Ask your health care provider if a screening pelvic exam is right for you.  If you have had past treatment for cervical cancer or a condition that could lead to cancer, you need Pap tests and screening for cancer for at least 20 years after your treatment. If Pap tests have been discontinued, your risk factors (such as having a new sexual partner) need to be reassessed to determine if screening should resume. Some women have medical problems that increase the chance of getting cervical cancer. In these cases, your  health care provider may recommend more frequent screening and Pap tests.  Colorectal Cancer  This type of cancer can be detected and often prevented.  Routine colorectal cancer screening usually begins at 55 years of age and continues through 55 years of age.  Your health care provider may recommend screening at an earlier age if you have risk factors for colon cancer.  Your health care provider may also recommend using home test kits to check for hidden blood in the stool.  A small camera at the end of a tube can be used to examine your colon directly (sigmoidoscopy or colonoscopy). This is done to check for the earliest forms of colorectal cancer.  Routine screening usually begins at age 39.  Direct examination of the colon should be repeated every 5-10 years through 55 years of age. However, you may need to be screened more often if early forms of precancerous polyps or small growths are found.  Skin Cancer  Check your skin from head to toe regularly.  Tell your health care provider about any new moles or changes in moles, especially if there is a change in a mole's shape or color.  Also tell your health care provider if you have a mole that is larger than the size of a pencil eraser.  Always use sunscreen. Apply sunscreen liberally and repeatedly throughout the day.  Protect yourself by wearing long sleeves, pants, a wide-brimmed hat, and sunglasses whenever you are outside.  Heart disease, diabetes, and high blood pressure  High blood pressure causes heart disease and increases the risk of stroke. High blood pressure is more likely to develop in: ? People who have blood pressure in the high end of the normal range (130-139/85-89 mm Hg). ? People who are overweight or obese. ? People who are African American.  If you are 67-72 years of age, have your blood pressure checked every 3-5 years. If you are 2 years of age or older, have your blood pressure checked every year. You  should have your blood pressure measured twice-once when you are at a hospital or clinic, and once when you are not at a hospital or clinic. Record the average of the two measurements. To check your blood pressure when you are not at a hospital or clinic, you can use: ? An automated blood pressure machine at a pharmacy. ? A home blood pressure monitor.  If you are between 42 years and 31 years old, ask your health care provider if you should take aspirin to prevent strokes.  Have regular diabetes screenings. This involves taking a blood sample to check your fasting blood sugar level. ? If you are at a normal weight  and have a low risk for diabetes, have this test once every three years after 55 years of age. ? If you are overweight and have a high risk for diabetes, consider being tested at a younger age or more often. Preventing infection Hepatitis B  If you have a higher risk for hepatitis B, you should be screened for this virus. You are considered at high risk for hepatitis B if: ? You were born in a country where hepatitis B is common. Ask your health care provider which countries are considered high risk. ? Your parents were born in a high-risk country, and you have not been immunized against hepatitis B (hepatitis B vaccine). ? You have HIV or AIDS. ? You use needles to inject street drugs. ? You live with someone who has hepatitis B. ? You have had sex with someone who has hepatitis B. ? You get hemodialysis treatment. ? You take certain medicines for conditions, including cancer, organ transplantation, and autoimmune conditions.  Hepatitis C  Blood testing is recommended for: ? Everyone born from [birth year] through 1963. ? Anyone with known risk factors for hepatitis C.  Sexually transmitted infections (STIs)  You should be screened for sexually transmitted infections (STIs) including gonorrhea and chlamydia if: ? You are sexually active and are younger than 55 years of age. ? You  are older than 55 years of age and your health care provider tells you that you are at risk for this type of infection. ? Your sexual activity has changed since you were last screened and you are at an increased risk for chlamydia or gonorrhea. Ask your health care provider if you are at risk.  If you do not have HIV, but are at risk, it may be recommended that you take a prescription medicine daily to prevent HIV infection. This is called pre-exposure prophylaxis (PrEP). You are considered at risk if: ? You are sexually active and do not regularly use condoms or know the HIV status of your partner(s). ? You take drugs by injection. ? You are sexually active with a partner who has HIV.  Talk with your health care provider about whether you are at high risk of being infected with HIV. If you choose to begin PrEP, you should first be tested for HIV. You should then be tested every 3 months for as long as you are taking PrEP. Pregnancy  If you are premenopausal and you may become pregnant, ask your health care provider about preconception counseling.  If you may become pregnant, take 400 to 800 micrograms (mcg) of folic acid every day.  If you want to prevent pregnancy, talk to your health care provider about birth control (contraception). Osteoporosis and menopause  Osteoporosis is a disease in which the bones lose minerals and strength with aging. This can result in serious bone fractures. Your risk for osteoporosis can be identified using a bone density scan.  If you are 31 years of age or older, or if you are at risk for osteoporosis and fractures, ask your health care provider if you should be screened.  Ask your health care provider whether you should take a calcium or vitamin D supplement to lower your risk for osteoporosis.  Menopause may have certain physical symptoms and risks.  Hormone replacement therapy may reduce some of these symptoms and risks. Talk to your health care  provider about whether hormone replacement therapy is right for you. Follow these instructions at home:  Schedule regular health, dental, and eye exams.  Stay current with your immunizations.  Do not use any tobacco products including cigarettes, chewing tobacco, or electronic cigarettes.  If you are pregnant, do not drink alcohol.  If you are breastfeeding, limit how much and how often you drink alcohol.  Limit alcohol intake to no more than 1 drink per day for nonpregnant women. One drink equals 12 ounces of beer, 5 ounces of wine, or 1 ounces of hard liquor.  Do not use street drugs.  Do not share needles.  Ask your health care provider for help if you need support or information about quitting drugs.  Tell your health care provider if you often feel depressed.  Tell your health care provider if you have ever been abused or do not feel safe at home. This information is not intended to replace advice given to you by your health care provider. Make sure you discuss any questions you have with your health care provider. Document Released: 08/14/2010 Document Revised: 07/07/2015 Document Reviewed: 11/02/2014 Elsevier Interactive Patient Education  2018 [place]. Panosh M.D.

## 2017-03-27 ENCOUNTER — Ambulatory Visit (INDEPENDENT_AMBULATORY_CARE_PROVIDER_SITE_OTHER): Payer: BLUE CROSS/BLUE SHIELD | Admitting: Internal Medicine

## 2017-03-27 ENCOUNTER — Encounter: Payer: Self-pay | Admitting: Internal Medicine

## 2017-03-27 VITALS — BP 124/82 | HR 71 | Temp 98.0°F | Ht 62.0 in | Wt 167.3 lb

## 2017-03-27 DIAGNOSIS — I10 Essential (primary) hypertension: Secondary | ICD-10-CM | POA: Diagnosis not present

## 2017-03-27 DIAGNOSIS — Z79890 Hormone replacement therapy (postmenopausal): Secondary | ICD-10-CM

## 2017-03-27 DIAGNOSIS — Z2821 Immunization not carried out because of patient refusal: Secondary | ICD-10-CM

## 2017-03-27 DIAGNOSIS — Z0000 Encounter for general adult medical examination without abnormal findings: Secondary | ICD-10-CM | POA: Diagnosis not present

## 2017-03-27 DIAGNOSIS — R7301 Impaired fasting glucose: Secondary | ICD-10-CM | POA: Diagnosis not present

## 2017-03-27 DIAGNOSIS — Z79899 Other long term (current) drug therapy: Secondary | ICD-10-CM | POA: Diagnosis not present

## 2017-03-27 DIAGNOSIS — E785 Hyperlipidemia, unspecified: Secondary | ICD-10-CM | POA: Diagnosis not present

## 2017-03-27 LAB — BASIC METABOLIC PANEL
BUN: 9 mg/dL (ref 6–23)
CALCIUM: 9 mg/dL (ref 8.4–10.5)
CHLORIDE: 102 meq/L (ref 96–112)
CO2: 30 meq/L (ref 19–32)
CREATININE: 0.67 mg/dL (ref 0.40–1.20)
GFR: 97.19 mL/min (ref >60.00)
Glucose, Bld: 112 mg/dL — ABNORMAL HIGH (ref 70–99)
Potassium: 4.1 mEq/L (ref 3.5–5.1)
Sodium: 139 mEq/L (ref 135–145)

## 2017-03-27 LAB — HEPATIC FUNCTION PANEL
ALT: 13 U/L (ref 0–35)
AST: 16 U/L (ref 0–37)
Albumin: 4.1 g/dL (ref 3.5–5.2)
Alkaline Phosphatase: 65 U/L (ref 39–117)
BILIRUBIN TOTAL: 0.5 mg/dL (ref 0.2–1.2)
Bilirubin, Direct: 0.1 mg/dL (ref 0.0–0.3)
Total Protein: 6.6 g/dL (ref 6.0–8.3)

## 2017-03-27 LAB — CBC WITH DIFFERENTIAL/PLATELET
BASOS ABS: 0 10*3/uL (ref 0.0–0.1)
BASOS PCT: 0.7 % (ref 0.0–3.0)
EOS ABS: 0 10*3/uL (ref 0.0–0.7)
Eosinophils Relative: 0.8 % (ref 0.0–5.0)
HEMATOCRIT: 37.2 % (ref 36.0–46.0)
Hemoglobin: 12.1 g/dL (ref 12.0–15.0)
LYMPHS PCT: 21.5 % (ref 12.0–46.0)
Lymphs Abs: 1.2 10*3/uL (ref 0.7–4.0)
MCHC: 32.7 g/dL (ref 30.0–36.0)
MCV: 88.6 fl (ref 78.0–100.0)
Monocytes Absolute: 0.4 10*3/uL (ref 0.1–1.0)
Monocytes Relative: 7.2 % (ref 3.0–12.0)
NEUTROS ABS: 3.9 10*3/uL (ref 1.4–7.7)
Neutrophils Relative %: 69.8 % (ref 43.0–77.0)
Platelets: 383 10*3/uL (ref 150.0–400.0)
RBC: 4.2 Mil/uL (ref 3.87–5.11)
RDW: 13.3 % (ref 11.5–15.5)
WBC: 5.5 10*3/uL (ref 4.0–10.5)

## 2017-03-27 LAB — LIPID PANEL
Cholesterol: 177 mg/dL (ref 0–200)
HDL: 63.5 mg/dL (ref >39.00)
LDL CALC: 92 mg/dL (ref 0–99)
NONHDL: 113.02
Total CHOL/HDL Ratio: 3
Triglycerides: 106 mg/dL (ref 0.0–149.0)
VLDL: 21.2 mg/dL (ref 0.0–40.0)

## 2017-03-27 LAB — TSH: TSH: 2.14 u[IU]/mL (ref 0.35–4.50)

## 2017-03-27 LAB — HEMOGLOBIN A1C: HEMOGLOBIN A1C: 5.7 % (ref 4.6–6.5)

## 2017-03-27 NOTE — Patient Instructions (Signed)
Glad  You are doing well.   Try extending the  hrt patch  To 10 days or so  If tolerated then we can     Try decreasing strength of   HRT    At some point.   Call [facility] about fu hand ? Dr Amedeo Plenty? Will notify you  of labs when available.   Health Maintenance, Female Adopting a healthy lifestyle and getting preventive care can go a long way to promote health and wellness. Talk with your health care provider about what schedule of regular examinations is right for you. This is a good chance for you to check in with your provider about disease prevention and staying healthy. In between checkups, there are plenty of things you can do on your own. Experts have done a lot of research about which lifestyle changes and preventive measures are most likely to keep you healthy. Ask your health care provider for more information. Weight and diet Eat a healthy diet  Be sure to include plenty of vegetables, fruits, low-fat dairy products, and lean protein.  Do not eat a lot of foods high in solid fats, added sugars, or salt.  Get regular exercise. This is one of the most important things you can do for your health. ? Most adults should exercise for at least 150 minutes each week. The exercise should increase your heart rate and make you sweat (moderate-intensity exercise). ? Most adults should also do strengthening exercises at least twice a week. This is in addition to the moderate-intensity exercise.  Maintain a healthy weight  Body mass index (BMI) is a measurement that can be used to identify possible weight problems. It estimates body fat based on height and weight. Your health care provider can help determine your BMI and help you achieve or maintain a healthy weight.  For females 55 years of age and older: ? A BMI below 18.5 is considered underweight. ? A BMI of 18.5 to 24.9 is normal. ? A BMI of 25 to 29.9 is considered overweight. ? A BMI of 30 and above is considered  obese.  Watch levels of cholesterol and blood lipids  You should start having your blood tested for lipids and cholesterol at 55 years of age, then have this test every 5 years.  You may need to have your cholesterol levels checked more often if: ? Your lipid or cholesterol levels are high. ? You are older than 55 years of age. ? You are at high risk for heart disease.  Cancer screening Lung Cancer  Lung cancer screening is recommended for adults 17-59 years old who are at high risk for lung cancer because of a history of smoking.  A yearly low-dose CT scan of the lungs is recommended for people who: ? Currently smoke. ? Have quit within the past 15 years. ? Have at least a 30-pack-year history of smoking. A pack year is smoking an average of one pack of cigarettes a day for 1 year.  Yearly screening should continue until it has been 15 years since you quit.  Yearly screening should stop if you develop a health problem that would prevent you from having lung cancer treatment.  Breast Cancer  Practice breast self-awareness. This means understanding how your breasts normally appear and feel.  It also means doing regular breast self-exams. Let your health care provider know about any changes, no matter how small.  If you are in your 20s or 30s, you should have a clinical breast  exam (CBE) by a health care provider every 1-3 years as part of a regular health exam.  If you are 55 or older, have a CBE every year. Also consider having a breast X-ray (mammogram) every year.  If you have a family history of breast cancer, talk to your health care provider about genetic screening.  If you are at high risk for breast cancer, talk to your health care provider about having an MRI and a mammogram every year.  Breast cancer gene (BRCA) assessment is recommended for women who have family members with BRCA-related cancers. BRCA-related cancers  include: ? Breast. ? Ovarian. ? Tubal. ? Peritoneal cancers.  Results of the assessment will determine the need for genetic counseling and BRCA1 and BRCA2 testing.  Cervical Cancer Your health care provider may recommend that you be screened regularly for cancer of the pelvic organs (ovaries, uterus, and vagina). This screening involves a pelvic examination, including checking for microscopic changes to the surface of your cervix (Pap test). You may be encouraged to have this screening done every 3 years, beginning at age 63.  For women ages 44-65, health care providers may recommend pelvic exams and Pap testing every 3 years, or they may recommend the Pap and pelvic exam, combined with testing for human papilloma virus (HPV), every 5 years. Some types of HPV increase your risk of cervical cancer. Testing for HPV may also be done on women of any age with unclear Pap test results.  Other health care providers may not recommend any screening for nonpregnant women who are considered low risk for pelvic cancer and who do not have symptoms. Ask your health care provider if a screening pelvic exam is right for you.  If you have had past treatment for cervical cancer or a condition that could lead to cancer, you need Pap tests and screening for cancer for at least 20 years after your treatment. If Pap tests have been discontinued, your risk factors (such as having a new sexual partner) need to be reassessed to determine if screening should resume. Some women have medical problems that increase the chance of getting cervical cancer. In these cases, your health care provider may recommend more frequent screening and Pap tests.  Colorectal Cancer  This type of cancer can be detected and often prevented.  Routine colorectal cancer screening usually begins at 55 years of age and continues through 55 years of age.  Your health care provider may recommend screening at an earlier age if you have risk factors  for colon cancer.  Your health care provider may also recommend using home test kits to check for hidden blood in the stool.  A small camera at the end of a tube can be used to examine your colon directly (sigmoidoscopy or colonoscopy). This is done to check for the earliest forms of colorectal cancer.  Routine screening usually begins at age 15.  Direct examination of the colon should be repeated every 5-10 years through 55 years of age. However, you may need to be screened more often if early forms of precancerous polyps or small growths are found.  Skin Cancer  Check your skin from head to toe regularly.  Tell your health care provider about any new moles or changes in moles, especially if there is a change in a mole's shape or color.  Also tell your health care provider if you have a mole that is larger than the size of a pencil eraser.  Always use sunscreen. Apply sunscreen  liberally and repeatedly throughout the day.  Protect yourself by wearing long sleeves, pants, a wide-brimmed hat, and sunglasses whenever you are outside.  Heart disease, diabetes, and high blood pressure  High blood pressure causes heart disease and increases the risk of stroke. High blood pressure is more likely to develop in: ? People who have blood pressure in the high end of the normal range (130-139/85-89 mm Hg). ? People who are overweight or obese. ? People who are African American.  If you are 32-98 years of age, have your blood pressure checked every 3-5 years. If you are 39 years of age or older, have your blood pressure checked every year. You should have your blood pressure measured twice-once when you are at a hospital or clinic, and once when you are not at a hospital or clinic. Record the average of the two measurements. To check your blood pressure when you are not at a hospital or clinic, you can use: ? An automated blood pressure machine at a pharmacy. ? A home blood pressure monitor.  If  you are between 37 years and 72 years old, ask your health care provider if you should take aspirin to prevent strokes.  Have regular diabetes screenings. This involves taking a blood sample to check your fasting blood sugar level. ? If you are at a normal weight and have a low risk for diabetes, have this test once every three years after 55 years of age. ? If you are overweight and have a high risk for diabetes, consider being tested at a younger age or more often. Preventing infection Hepatitis B  If you have a higher risk for hepatitis B, you should be screened for this virus. You are considered at high risk for hepatitis B if: ? You were born in a country where hepatitis B is common. Ask your health care provider which countries are considered high risk. ? Your parents were born in a high-risk country, and you have not been immunized against hepatitis B (hepatitis B vaccine). ? You have HIV or AIDS. ? You use needles to inject street drugs. ? You live with someone who has hepatitis B. ? You have had sex with someone who has hepatitis B. ? You get hemodialysis treatment. ? You take certain medicines for conditions, including cancer, organ transplantation, and autoimmune conditions.  Hepatitis C  Blood testing is recommended for: ? Everyone born from [birth year] through 1963. ? Anyone with known risk factors for hepatitis C.  Sexually transmitted infections (STIs)  You should be screened for sexually transmitted infections (STIs) including gonorrhea and chlamydia if: ? You are sexually active and are younger than 55 years of age. ? You are older than 55 years of age and your health care provider tells you that you are at risk for this type of infection. ? Your sexual activity has changed since you were last screened and you are at an increased risk for chlamydia or gonorrhea. Ask your health care provider if you are at risk.  If you do not have HIV, but are at risk, it may be recommended  that you take a prescription medicine daily to prevent HIV infection. This is called pre-exposure prophylaxis (PrEP). You are considered at risk if: ? You are sexually active and do not regularly use condoms or know the HIV status of your partner(s). ? You take drugs by injection. ? You are sexually active with a partner who has HIV.  Talk with your health care provider about  whether you are at high risk of being infected with HIV. If you choose to begin PrEP, you should first be tested for HIV. You should then be tested every 3 months for as long as you are taking PrEP. Pregnancy  If you are premenopausal and you may become pregnant, ask your health care provider about preconception counseling.  If you may become pregnant, take 400 to 800 micrograms (mcg) of folic acid every day.  If you want to prevent pregnancy, talk to your health care provider about birth control (contraception). Osteoporosis and menopause  Osteoporosis is a disease in which the bones lose minerals and strength with aging. This can result in serious bone fractures. Your risk for osteoporosis can be identified using a bone density scan.  If you are 64 years of age or older, or if you are at risk for osteoporosis and fractures, ask your health care provider if you should be screened.  Ask your health care provider whether you should take a calcium or vitamin D supplement to lower your risk for osteoporosis.  Menopause may have certain physical symptoms and risks.  Hormone replacement therapy may reduce some of these symptoms and risks. Talk to your health care provider about whether hormone replacement therapy is right for you. Follow these instructions at home:  Schedule regular health, dental, and eye exams.  Stay current with your immunizations.  Do not use any tobacco products including cigarettes, chewing tobacco, or electronic cigarettes.  If you are pregnant, do not drink alcohol.  If you are  breastfeeding, limit how much and how often you drink alcohol.  Limit alcohol intake to no more than 1 drink per day for nonpregnant women. One drink equals 12 ounces of beer, 5 ounces of wine, or 1 ounces of hard liquor.  Do not use street drugs.  Do not share needles.  Ask your health care provider for help if you need support or information about quitting drugs.  Tell your health care provider if you often feel depressed.  Tell your health care provider if you have ever been abused or do not feel safe at home. This information is not intended to replace advice given to you by your health care provider. Make sure you discuss any questions you have with your health care provider. Document Released: 08/14/2010 Document Revised: 07/07/2015 Document Reviewed: 11/02/2014 Elsevier Interactive Patient Education  [employer].

## 2017-03-28 ENCOUNTER — Encounter: Payer: Self-pay | Admitting: Internal Medicine

## 2017-03-28 NOTE — Telephone Encounter (Signed)
Please advise Dr Regis Bill, pt is concerned of her recent glucose lab levels. thanks.   Basic metabolic panel  Status:  Final result   Ref Range & Units 1d ago (03/27/17) 65yr ago (03/21/16) 67yr ago (09/13/15)  Sodium 135 - 145 mEq/L 139  138  135   Potassium 3.5 - 5.1 mEq/L 4.1  4.6  3.8   Chloride 96 - 112 mEq/L 102  102  97   CO2 19 - 32 mEq/L 30  31  30    Glucose, Bld 70 - 99 mg/dL 112 Abnormally high   96  94   BUN 6 - 23 mg/dL 9  11  10    Creatinine, Ser 0.40 - 1.20 mg/dL 0.67  0.62  0.72   Calcium 8.4 - 10.5 mg/dL 9.0  9.3  9.6   GFR >60.00 mL/min 97.19  106.69  89.96   Resulting [place]

## 2017-03-28 NOTE — Telephone Encounter (Signed)
dont panic   One elevated fasting sugar    With normal a1c ( 90 day average)   is not diabetes although may show a risk .   Would  Intensify lifestyle interventions.   And   We can repeat fasting blood sugar  And hga 1c in 3-4 months     No ov needed

## 2017-04-01 ENCOUNTER — Other Ambulatory Visit: Payer: Self-pay

## 2017-04-01 MED ORDER — PRAVASTATIN SODIUM 80 MG PO TABS: 80.0000 mg | ORAL_TABLET | Freq: Every evening | ORAL | 0 refills | Status: DC

## 2017-04-05 ENCOUNTER — Other Ambulatory Visit: Payer: Self-pay | Admitting: *Deleted

## 2017-04-05 ENCOUNTER — Telehealth: Payer: Self-pay | Admitting: Cardiology

## 2017-04-05 MED ORDER — PRAVASTATIN SODIUM 80 MG PO TABS: 80.0000 mg | ORAL_TABLET | Freq: Every evening | ORAL | 0 refills | Status: DC

## 2017-04-05 NOTE — Telephone Encounter (Signed)
Tamera calling with [facility]   Pt c/o medication issue:  1. Name of Medication: Pravastatin   2. How are you currently taking this medication (dosage and times per day)? 80 mg   3. Are you having a reaction (difficulty breathing--STAT)? no  4. What is your medication issue? Patient states that she cannot tolerate 80 mg because it causes her legs to hurt and would like to have prescription for 40 mg instead

## 2017-04-05 NOTE — Telephone Encounter (Signed)
OK to prescribe 40 mg Pravachol daily.  Disp number 31 with 11 refills.

## 2017-04-08 MED ORDER — PRAVASTATIN SODIUM 40 MG PO TABS: 40.0000 mg | ORAL_TABLET | Freq: Every evening | ORAL | 3 refills | Status: DC

## 2017-04-08 NOTE — Telephone Encounter (Signed)
New script sent to the pharmacy for pravastatin 40 mg once daily.

## 2017-04-15 ENCOUNTER — Other Ambulatory Visit: Payer: Self-pay | Admitting: Internal Medicine

## 2017-05-12 ENCOUNTER — Other Ambulatory Visit: Payer: Self-pay | Admitting: Internal Medicine

## 2017-05-23 ENCOUNTER — Telehealth (HOSPITAL_COMMUNITY): Payer: Self-pay

## 2017-05-23 NOTE — Telephone Encounter (Signed)
Encounter complete. 

## 2017-05-24 ENCOUNTER — Telehealth (HOSPITAL_COMMUNITY): Payer: Self-pay

## 2017-05-24 NOTE — Telephone Encounter (Signed)
Encounter complete. 

## 2017-05-28 ENCOUNTER — Ambulatory Visit (HOSPITAL_COMMUNITY)
Admission: RE | Admit: 2017-05-28 | Discharge: 2017-05-28 | Disposition: A | Discharge status: Home / Self Care | Payer: BLUE CROSS/BLUE SHIELD | Source: Ambulatory Visit | Attending: Cardiology | Admitting: Cardiology

## 2017-05-28 DIAGNOSIS — I10 Essential (primary) hypertension: Secondary | ICD-10-CM

## 2017-05-28 LAB — EXERCISE TOLERANCE TEST
CHL RATE OF PERCEIVED EXERTION: 18
CSEPEDS: 28 s
CSEPHR: 100 %
Estimated workload: 12.5 METS
Exercise duration (min): 10 min
MPHR: 165 {beats}/min
Peak HR: 166 {beats}/min
Rest HR: 82 {beats}/min

## 2017-06-20 NOTE — Progress Notes (Signed)
Cardiology Office Note   Date:  06/21/2017   ID:  Jacqueline Beasley, DOB 08/31/1960, MRN [id number]  PCP:  Burnis Medin, MD  Cardiologist:   Minus Breeding, MD  Referring:  Burnis Medin, MD  Chief Complaint  Patient presents with  . Elevated Coronary Calcium Score      History of Present Illness: Jacqueline Beasley is a 57 y.o. female who presents for evaluation of chest pain.  At the first appt I performed a POET (Plain Old Exercise Treadmill).  She exercised for 11 minutes without any ischemic ST T wave changes.  She had a negative POET (Plain Old Exercise Treadmill) last month.   She had an excellent exercise tolerance.   She has been doing well since I saw her.  She exercises routinely.  The patient denies any new symptoms such as chest discomfort, neck or arm discomfort. There has been no new shortness of breath, PND or orthopnea. There have been no reported palpitations, presyncope or syncope.  Past Medical History:  Diagnosis Date  . Allergy    seasonal  . 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 Back pain    Recurrent has seen neurosurgery in past  . History of anal fissures    pt has itching with this at times  . Hx of varicella   . Hypertension     Past Surgical History:  Procedure Laterality Date  . ABDOMINAL HYSTERECTOMY     total with bso  for fibroids.   . AUGMENTATION MAMMAPLASTY    . BREAST ENHANCEMENT SURGERY  2010  . CESAREAN SECTION  [phone]     Current Outpatient Medications  Medication Sig Dispense Refill  . Cetirizine HCl (ZYRTEC ALLERGY) 10 MG CAPS Take 10 mg by mouth as needed. Reported on 03/23/2015    . cholecalciferol (VITAMIN D) 1000 UNITS tablet Take 2,000 Units by mouth daily.    . diphenhydrAMINE (BENADRYL) 25 MG tablet Take 25 mg by mouth every 6 (six) hours as needed.    Marland Kitchen estradiol (CLIMARA - DOSED IN MG/24 HR) 0.075 mg/24hr patch PLACE 1 PATCH (0.075 MG TOTAL) ONTO THE SKIN ONCE A WEEK. 4 patch 0  . fluticasone (FLONASE) 50 MCG/ACT nasal  spray Place 2 sprays into both nostrils daily.    Marland Kitchen lisinopril (PRINIVIL,ZESTRIL) 20 MG tablet Take 1 tablet (20 mg total) by mouth daily. 90 tablet 3  . pantoprazole (PROTONIX) 40 MG tablet TAKE 1 TABLET BY MOUTH EVERY DAY 90 tablet 1  . pravastatin (PRAVACHOL) 40 MG tablet Take 40 mg by mouth every other day.    Marland Kitchen VITAMIN E PO Take by mouth.     No current facility-administered medications for this visit.     Allergies:   Ambien [zolpidem]    ROS:  Please see the history of present illness.   Otherwise, review of systems are positive for none.   All other systems are reviewed and negative.    PHYSICAL EXAM: VS:  BP 126/84   Pulse 75   Ht 5\' 2"  (1.575 m)   Wt 167 lb 9.6 oz (76 kg)   BMI 30.65 kg/m  , BMI Body mass index is 30.65 kg/m.  GENERAL:  Well appearing NECK:  No jugular venous distention, waveform within normal limits, carotid upstroke brisk and symmetric, no bruits, no thyromegaly LUNGS:  Clear to auscultation bilaterally CHEST:  Unremarkable HEART:  PMI not displaced or sustained,S1 and S2 within normal limits, no S3, no S4, no  clicks, no rubs, no murmurs ABD:  Flat, positive bowel sounds normal in frequency in pitch, no bruits, no rebound, no guarding, no midline pulsatile mass, no hepatomegaly, no splenomegaly EXT:  2 plus pulses throughout, no edema, no cyanosis no clubbing   EKG:  EKG  ordered today. Sinus rhythm, rate 75, axis within normal limits, intervals within normal limits, no acute ST-T wave changes.  Recent Labs: 03/27/2017: ALT 13; BUN 9; Creatinine, Ser 0.67; Hemoglobin 12.1; Platelets 383.0; Potassium 4.1; Sodium 139; TSH 2.14    Lipid Panel    Component Value Date/Time   CHOL 177 03/27/2017 1043   TRIG 106.0 03/27/2017 1043   HDL 63.50 03/27/2017 1043   CHOLHDL 3 03/27/2017 1043   VLDL 21.2 03/27/2017 1043   LDLCALC 92 03/27/2017 1043   LDLDIRECT 149.9 03/03/2013 1106      Wt Readings from Last 3 Encounters:  06/21/17 167 lb 9.6 oz (76  kg)  03/27/17 167 lb 4.8 oz (75.9 kg)  05/31/16 168 lb (76.2 kg)      Other studies Reviewed: Additional studies/ records that were reviewed today include: None we again discussed risk reduction. Review of the above records demonstrates:       ASSESSMENT AND PLAN:  ELEVATED CORONARY CALCIUM:     I will follow-up next year again with a POET (Plain Old Exercise Treadmill)  DYSLIPIDEMIA:     She is unable to tolerate the pravastatin every day so she takes it every other day.Her LDL was slightly better and HDL higher than previous.  I suggested the Mediterranean diet.  We will continue meds as she is taking them.  HTN:  The blood pressure is at target. No change in medications is indicated. We will continue with therapeutic lifestyle changes (TLC).   Current medicines are reviewed at length with the patient today.  The patient does not have concerns regarding medicines.  The following changes have been made:   None.  Labs/ tests ordered today include: No she is taking her pravastatin every other day   Orders Placed This Encounter  Procedures  . EXERCISE TOLERANCE TEST (ETT)  . EKG 12-Lead     Disposition:   FU with me after POET next year.    Signed, Minus Breeding, MD  06/21/2017 8:39 AM    [facility]

## 2017-06-21 ENCOUNTER — Ambulatory Visit: Payer: BLUE CROSS/BLUE SHIELD | Admitting: Cardiology

## 2017-06-21 ENCOUNTER — Encounter: Payer: Self-pay | Admitting: Cardiology

## 2017-06-21 VITALS — BP 126/84 | HR 75 | Ht 62.0 in | Wt 167.6 lb

## 2017-06-21 DIAGNOSIS — E785 Hyperlipidemia, unspecified: Secondary | ICD-10-CM

## 2017-06-21 DIAGNOSIS — I2584 Coronary atherosclerosis due to calcified coronary lesion: Secondary | ICD-10-CM

## 2017-06-21 DIAGNOSIS — I10 Essential (primary) hypertension: Secondary | ICD-10-CM

## 2017-06-21 DIAGNOSIS — I2510 Atherosclerotic heart disease of native coronary artery without angina pectoris: Secondary | ICD-10-CM | POA: Diagnosis not present

## 2017-06-21 NOTE — Patient Instructions (Signed)
Medication Instructions:  Continue current medications  If you need a refill on your cardiac medications before your next appointment, please call your pharmacy.  Labwork: None ordered   Testing/Procedures: Your physician has requested that you have an exercise tolerance test in 1 Year. For further information please visit [email or web address]. Please also follow instruction sheet, as given.  Follow-Up: Your physician wants you to follow-up in: 1 Year. You should receive a reminder letter in the mail two months in advance. If you do not receive a letter, please call our office [phone].      Thank you for choosing [facility] at [facility]!!

## 2017-07-14 ENCOUNTER — Other Ambulatory Visit: Payer: Self-pay | Admitting: Cardiology

## 2017-07-15 NOTE — Telephone Encounter (Signed)
Rx sent to pharmacy   

## 2017-07-24 ENCOUNTER — Other Ambulatory Visit: Payer: Self-pay | Admitting: Internal Medicine

## 2017-10-29 ENCOUNTER — Telehealth: Payer: BLUE CROSS/BLUE SHIELD | Admitting: Family Medicine

## 2017-10-29 DIAGNOSIS — J019 Acute sinusitis, unspecified: Secondary | ICD-10-CM

## 2017-10-29 MED ORDER — AMOXICILLIN-POT CLAVULANATE 875-125 MG PO TABS: 1.0000 | ORAL_TABLET | Freq: Two times a day (BID) | ORAL | 0 refills | Status: AC

## 2017-10-29 NOTE — Progress Notes (Signed)

## 2017-10-31 ENCOUNTER — Other Ambulatory Visit: Payer: Self-pay | Admitting: Internal Medicine

## 2017-11-21 ENCOUNTER — Other Ambulatory Visit: Payer: Self-pay | Admitting: Internal Medicine

## 2018-01-27 ENCOUNTER — Other Ambulatory Visit: Payer: Self-pay | Admitting: Internal Medicine

## 2018-01-27 DIAGNOSIS — Z1231 Encounter for screening mammogram for malignant neoplasm of breast: Secondary | ICD-10-CM

## 2018-02-21 ENCOUNTER — Other Ambulatory Visit: Payer: Self-pay | Admitting: Internal Medicine

## 2018-03-05 ENCOUNTER — Ambulatory Visit
Admission: RE | Admit: 2018-03-05 | Discharge: 2018-03-05 | Disposition: A | Discharge status: Home / Self Care | Payer: BLUE CROSS/BLUE SHIELD | Source: Ambulatory Visit | Attending: Internal Medicine | Admitting: Internal Medicine

## 2018-03-05 DIAGNOSIS — Z1231 Encounter for screening mammogram for malignant neoplasm of breast: Secondary | ICD-10-CM

## 2018-03-28 ENCOUNTER — Encounter: Payer: BLUE CROSS/BLUE SHIELD | Admitting: Internal Medicine

## 2018-04-22 NOTE — Telephone Encounter (Signed)
Hard to tell  Sending in med without ov can be problematic and risky   especially if you have a fever  . To make sure there is no pnumonia  Make sure you are trying Flonase  Every day  Is there an Urgent care near you to be seen ?   Or else here

## 2018-04-23 ENCOUNTER — Ambulatory Visit: Payer: BLUE CROSS/BLUE SHIELD | Admitting: Internal Medicine

## 2018-04-23 ENCOUNTER — Encounter: Payer: Self-pay | Admitting: Internal Medicine

## 2018-04-23 ENCOUNTER — Other Ambulatory Visit: Payer: Self-pay

## 2018-04-23 VITALS — BP 140/80 | HR 87 | Temp 98.5°F | Wt 172.1 lb

## 2018-04-23 DIAGNOSIS — J069 Acute upper respiratory infection, unspecified: Secondary | ICD-10-CM

## 2018-04-23 DIAGNOSIS — J019 Acute sinusitis, unspecified: Secondary | ICD-10-CM

## 2018-04-23 MED ORDER — AMOXICILLIN-POT CLAVULANATE 875-125 MG PO TABS: 1.0000 | ORAL_TABLET | Freq: Two times a day (BID) | ORAL | 0 refills | Status: DC

## 2018-04-23 MED ORDER — PREDNISONE 20 MG PO TABS: 20.0000 mg | ORAL_TABLET | Freq: Two times a day (BID) | ORAL | 0 refills | Status: DC

## 2018-04-23 NOTE — Patient Instructions (Signed)
Treating for sinusitis .  You lung exam is clear .   If pressure not improveing after  3 days.  Can add the prednisone  For 3- 5 days to decrease  Swelling insinus passages and help irritable airway and lungs

## 2018-04-23 NOTE — Progress Notes (Signed)
Chief Complaint  Patient presents with   Cough    x 1 month. dry cough. Pt states chest feels "raw" from coughing. headaches, felt feverish, and sinus pressure. pt states her right ear has been hurting for the past couple days     HPI: Jacqueline Beasley 58 y.o. come in for on going resp sx and cough  See phone and my chart message .  Cough for weeks but sinus [ressure  Bad  And   Sudafed tylenol and saline wash not working . Like usual  Onset like allergy    But ongoing.   But not responding to  Self care  Had sneezing like a head cold .  Now nauseaous. and pnd and cough problematic . No current fever but feels achyFelt feverish off and on   No cp sob   Last sinus issues fall 19 and was  rx with Augmentin and helped dramatically  Last fall sinus infection.  Was rx   E  . Little blood  And nasty  Mucoid  And cough is dry .  ROS: See pertinent positives and negatives per HPI. 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   Back pain    Recurrent has seen neurosurgery in past   History of anal fissures    pt has itching with this at times   Hx of varicella    Hypertension     Family History  Problem Relation Age of Onset   Cancer Mother        Uterus   Stroke Mother        died 12    Hypertension Mother    Diabetes Maternal Grandmother    Hypertension Sister    Colon cancer Neg Hx     Social History   Socioeconomic History   Marital status: Married    Spouse name: Not on file   Number of children: 2   Years of education: Not on file   Highest education level: Not on file  Occupational History   Not on file  Social Needs   Financial resource strain: Not on file   Food insecurity:    Worry: Not on file    Inability: Not on file   Transportation needs:    Medical: Not on file    Non-medical: Not on file  Tobacco Use   Smoking status: Never Smoker   Smokeless tobacco:  Never Used  Substance and Sexual Activity   Alcohol use: Yes    Alcohol/week: 7.0 standard drinks    Types: 7 Glasses of wine per week    Comment: 1 glass of wine a night   Drug use: No   Sexual activity: Not on file  Lifestyle   Physical activity:    Days per week: Not on file    Minutes per session: Not on file   Stress: Not on file  Relationships   Social connections:    Talks on phone: Not on file    Gets together: Not on file    Attends religious service: Not on file    Active member of club or organization: Not on file    Attends meetings of clubs or organizations: Not on file    Relationship status: Not on file  Other Topics Concern   Not on file  Social History Narrative   Lives at home with husband.  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 Reported on 03/23/2015     cholecalciferol (VITAMIN D) 1000 UNITS tablet Take 2,000 Units by mouth daily.     diphenhydrAMINE (BENADRYL) 25 MG tablet Take 25 mg by mouth every 6 (six) hours as needed.     estradiol (CLIMARA - DOSED IN MG/24 HR) 0.075 mg/24hr patch PLACE 1 PATCH (0.075 MG TOTAL) ONTO THE SKIN ONCE A WEEK. 12 patch 1   fluticasone (FLONASE) 50 MCG/ACT nasal spray Place 2 sprays into both nostrils daily.     lisinopril (PRINIVIL,ZESTRIL) 20 MG tablet TAKE 1 TABLET BY MOUTH EVERY DAY 90 tablet 3   pantoprazole (PROTONIX) 40 MG tablet TAKE 1 TABLET BY MOUTH EVERY DAY 90 tablet 0   pravastatin (PRAVACHOL) 40 MG tablet Take 40 mg by mouth every other day.     VITAMIN E PO Take by mouth.     No facility-administered medications prior to visit.      EXAM:  BP 140/80 (BP Location: Left Arm, Patient Position: Sitting, Cuff Size: Normal)    Pulse 87    Temp 98.5 F (36.9 C)  (Oral)    Wt 172 lb 1.6 oz (78.1 kg)    SpO2 98%    BMI 31.48 kg/m   Body mass index is 31.48 kg/m. WDWN in NAD  quiet respirations; mildly congested  somewhat hoarse. Non toxic . HEENT: Normocephalic ;atraumatic , Eyes;  PERRL, EOMs  Full, lids and conjunctiva clear,,Ears: no deformities, canals nl, TM landmarks normal, Nose: no deformity or discharge but congested;face minimally tender Mouth : OP clear without lesion or edema . Neck: Supple without adenopathy or masses or bruits Chest:  Clear to A without wheezes rales or rhonchi CV:  S1-S2 no gallops or murmurs peripheral perfusion is normal Skin :nl perfusion and no acute rashes   Lab Results  Component Value Date   WBC 5.5 03/27/2017   HGB 12.1 03/27/2017   HCT 37.2 03/27/2017   PLT 383.0 03/27/2017   GLUCOSE 112 (H) 03/27/2017   CHOL 177 03/27/2017   TRIG 106.0 03/27/2017   HDL 63.50 03/27/2017   LDLDIRECT 149.9 03/03/2013   LDLCALC 92 03/27/2017   ALT 13 03/27/2017   AST 16 03/27/2017   NA 139 03/27/2017   K 4.1 03/27/2017   CL 102 03/27/2017   CREATININE 0.67 03/27/2017   BUN 9 03/27/2017   CO2 30 03/27/2017   TSH 2.14 03/27/2017   HGBA1C 5.7 03/27/2017   BP Readings from Last 3 Encounters:  04/23/18 140/80  06/21/17 126/84  03/27/17 124/82    ASSESSMENT AND PLAN:  Discussed the following assessment and plan:  Protracted URI w cough   Acute sinusitis with symptoms greater than 10 days Persistent uri sx  With sinus involvement    Expectant management. Add antibiotic  3-5 d pred  If needed  Disc about meds  Etc   Fu with alarm sx   No evidence of pna today.  -Patient advised to return or notify health care team  if  new concerns arise.  Patient Instructions  Treating for sinusitis .  You lung exam is clear .   If pressure not improveing after  3 days.  Can add the  prednisone  For 3- 5 days to decrease  Swelling insinus passages and help irritable airway and lungs     Mariann Laster K. Kristol Almanzar M.D.

## 2018-05-12 NOTE — Telephone Encounter (Signed)
Called and spoke with the pt and she has been r/s'd her CPE and she stated that she does not have any other issues at this time.  Nothing further needed.

## 2018-05-14 ENCOUNTER — Encounter: Payer: BLUE CROSS/BLUE SHIELD | Admitting: Internal Medicine

## 2018-05-17 ENCOUNTER — Other Ambulatory Visit: Payer: Self-pay | Admitting: Internal Medicine

## 2018-05-22 ENCOUNTER — Other Ambulatory Visit: Payer: Self-pay | Admitting: Internal Medicine

## 2018-06-25 ENCOUNTER — Encounter (HOSPITAL_COMMUNITY): Payer: BLUE CROSS/BLUE SHIELD

## 2018-06-27 ENCOUNTER — Ambulatory Visit: Payer: BLUE CROSS/BLUE SHIELD | Admitting: Cardiology

## 2018-07-11 ENCOUNTER — Other Ambulatory Visit: Payer: Self-pay | Admitting: Cardiology

## 2018-08-12 NOTE — Progress Notes (Signed)
Chief Complaint  Patient presents with  . Annual Exam    Pt has no concerns     HPI: Patient  Jacqueline Beasley  58 y.o. comes in today for Preventive Health Care visit  Not truly fasting but only  Had   coffee  Powdered creamer and   d sugar free lemon drop.    Bp readings  130/upper 70s  Does not think aceI    Allergy cough  flonase   And sudafed and  Saline .   HLD   Cannot   ? Sx  numb and hurts.all over and to discuss with cardiology   Trying to wean off HRT   but May for 2 weeks .  Some flushes    Health Maintenance  Topic Date Due  . Hepatitis C Screening  09[birth year]  . HIV Screening  05/[birth year]/1977  . INFLUENZA VACCINE  09/13/2018  . MAMMOGRAM  03/05/2020  . COLONOSCOPY  04/04/2023  . TETANUS/TDAP  03/22/2025   Health Maintenance Review LIFESTYLE:  Exercise:   Walking  Yoga .   Tobacco/ETS: no Alcohol:   2 per day per week.  Sugar beverages: no Sleep:  Ave 6  Wakening  Drug use: no HH of Work: 2  2 Therapist, art office    ROS:  GEN/ HEENT: No fever, significant weight changes sweats headaches vision problems hearing changes, CV/ PULM; No chest pain shortness of breath cough, syncope,edema  change in exercise tolerance. GI /GU: No adominal pain, vomiting, change in bowel habits. No blood in the stool. No significant GU symptoms. SKIN/HEME: ,no acute skin rashes suspicious lesions or bleeding. No lymphadenopathy, nodules, masses.  NEURO/ PSYCH:  No neurologic signs such as weakness numbness. No depression anxiety. IMM/ Allergy: No unusual infections.  Allergy .   REST of 12 system review negative except as per HPI   Past Medical History:  Diagnosis Date  . Allergy    seasonal  . 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 Back pain    Recurrent has seen neurosurgery in past  . History of anal fissures    pt has itching with this at times  . Hx of varicella   . Hypertension     Past Surgical History:  Procedure Laterality Date  . ABDOMINAL HYSTERECTOMY      total with bso  for fibroids.   . AUGMENTATION MAMMAPLASTY    . BREAST ENHANCEMENT SURGERY  2010  . CESAREAN SECTION  [id number]    Family History  Problem Relation Age of Onset  . Cancer Mother        Uterus  . Stroke Mother        died 38   . Hypertension Mother   . Diabetes Maternal Grandmother   . Hypertension Sister   . Colon cancer Neg Hx     Social History   Socioeconomic History  . Marital status: Married    Spouse name: Not on file  . Number of children: 2  . Years of education: Not on file  . Highest education level: Not on file  Occupational History  . Not on file  Social Needs  . Financial resource strain: Not on file  . Food insecurity    Worry: Not on file    Inability: Not on file  . Transportation needs    Medical: Not on file    Non-medical: Not on file  Tobacco Use  . Smoking status: Never Smoker  .  Smokeless tobacco: Never Used  Substance and Sexual Activity  . Alcohol use: Yes    Alcohol/week: 7.0 standard drinks    Types: 7 Glasses of wine per week    Comment: 1 glass of wine a night  . Drug use: No  . Sexual activity: Not on file  Lifestyle  . Physical activity    Days per week: Not on file    Minutes per session: Not on file  . Stress: Not on file  Relationships  . Social Herbalist on phone: Not on file    Gets together: Not on file    Attends religious service: Not on file    Active member of club or organization: Not on file    Attends meetings of clubs or organizations: Not on file    Relationship status: Not on file  Other Topics Concern  . Not on file  Social History Narrative   Lives at home with husband.   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 Cetirizine HCl (ZYRTEC ALLERGY) 10 MG CAPS Take 10 mg by mouth as needed. Reported on 03/23/2015    .  cholecalciferol (VITAMIN D) 1000 UNITS tablet Take 2,000 Units by mouth daily.    . diphenhydrAMINE (BENADRYL) 25 MG tablet Take 25 mg by mouth every 6 (six) hours as needed.    Marland Kitchen estradiol (CLIMARA - DOSED IN MG/24 HR) 0.075 mg/24hr patch PLACE 1 PATCH (0.075 MG TOTAL) ONTO THE SKIN ONCE A WEEK. 12 patch 1  . fluticasone (FLONASE) 50 MCG/ACT nasal spray Place 2 sprays into both nostrils daily.    Marland Kitchen lisinopril (ZESTRIL) 20 MG tablet TAKE 1 TABLET BY MOUTH EVERY DAY 90 tablet 3  . pantoprazole (PROTONIX) 40 MG tablet TAKE 1 TABLET BY MOUTH EVERY DAY 90 tablet 0  . VITAMIN E PO Take by mouth.    . pravastatin (PRAVACHOL) 40 MG tablet Take 40 mg by mouth every other day.    Marland Kitchen amoxicillin-clavulanate (AUGMENTIN) 875-125 MG tablet Take 1 tablet by mouth every 12 (twelve) hours. For sinusitis 14 tablet 0  . predniSONE (DELTASONE) 20 MG tablet Take 1 tablet (20 mg total) by mouth 2 (two) times daily with a meal. 10 tablet 0   No facility-administered medications prior to visit.      EXAM:  BP 138/82 (BP Location: Right Arm, Patient Position: Sitting, Cuff Size: Normal)   Pulse 78   Temp 98.4 F (36.9 C) (Oral)   Ht 5' 2.25" (1.581 m)   Wt 170 lb 12.8 oz (77.5 kg)   SpO2 99%   BMI 30.99 kg/m   Body mass index is 30.99 kg/m. Wt Readings from Last 3 Encounters:  08/13/18 170 lb 12.8 oz (77.5 kg)  04/23/18 172 lb 1.6 oz (78.1 kg)  06/21/17 167 lb 9.6 oz (76 kg)    Physical Exam: Vital signs reviewed FOY:DXAJ is a well-developed well-nourished alert cooperative    who appearsr stated age in no acute distress.  HEENT: normocephalic atraumatic , Eyes: PERRL EOM's full, conjunctiva clear, Nares: paten,t no deformity discharge or tenderness., Ears: no deformity EAC's clear TMs with normal landmarks. Mouth:deferred NECK: supple without masses, thyromegaly or bruits. CHEST/PULM:  Clear to auscultation and percussion breath sounds equal no wheeze , rales or rhonchi. No chest wall deformities  or  tenderness. Breast: normal by inspection . No dimpling, discharge, masses, tenderness or discharge  Implants . CV: PMI is nondisplaced, S1 S2 no gallops, murmurs, rubs. Peripheral pulses are full without delay.No JVD .  ABDOMEN: Bowel sounds normal nontender  No guard or rebound, no hepato splenomegal no CVA tenderness.   Extremtities:  No clubbing cyanosis or edema, no acute joint swelling or redness no focal atrophy NEURO:  Oriented x3, cranial nerves 3-12 appear to be intact, no obvious focal weakness,gait within normal limits no abnormal reflexes or asymmetrical SKIN: No acute rashes normal turgor, color, no bruising or petechiae. PSYCH: Oriented, good eye contact, no obvious depression anxiety, cognition and judgment appear normal. LN: no cervical axillary i adenopathy  Lab Results  Component Value Date   WBC 5.5 03/27/2017   HGB 12.1 03/27/2017   HCT 37.2 03/27/2017   PLT 383.0 03/27/2017   GLUCOSE 112 (H) 03/27/2017   CHOL 177 03/27/2017   TRIG 106.0 03/27/2017   HDL 63.50 03/27/2017   LDLDIRECT 149.9 03/03/2013   LDLCALC 92 03/27/2017   ALT 13 03/27/2017   AST 16 03/27/2017   NA 139 03/27/2017   K 4.1 03/27/2017   CL 102 03/27/2017   CREATININE 0.67 03/27/2017   BUN 9 03/27/2017   CO2 30 03/27/2017   TSH 2.14 03/27/2017   HGBA1C 5.7 03/27/2017    BP Readings from Last 3 Encounters:  08/13/18 138/82  04/23/18 140/80  06/21/17 126/84    Lab plan   reviewed with patient   ASSESSMENT AND PLAN:  Discussed the following assessment and plan:    ICD-10-CM   1. Visit for preventive health examination  B93.90 Basic metabolic panel    CBC with Differential/Platelet    Hemoglobin A1c    Hepatic function panel    Lipid panel    TSH  2. Medication management  Z00.923 Basic metabolic panel    CBC with Differential/Platelet    Hemoglobin A1c    Hepatic function panel    Lipid panel    TSH  3. Family history of diabetes mellitus  R00.7 Basic metabolic panel    CBC  with Differential/Platelet    Hemoglobin A1c    Hepatic function panel    Lipid panel    TSH  4. Fasting hyperglycemia  M22.63 Basic metabolic panel    CBC with Differential/Platelet    Hemoglobin A1c    Hepatic function panel    Lipid panel    TSH  5. Essential hypertension  F35 Basic metabolic panel    CBC with Differential/Platelet    Hemoglobin A1c    Hepatic function panel    Lipid panel    TSH  6. Hyperlipidemia, unspecified hyperlipidemia type  K56.2 Basic metabolic panel    CBC with Differential/Platelet    Hemoglobin A1c    Hepatic function panel    Lipid panel    TSH  7. Hyperglycemia  B63.8 Basic metabolic panel    CBC with Differential/Platelet    Hemoglobin A1c    Hepatic function panel    Lipid panel    TSH  8. Need for hepatitis C screening test  Z11.59 Hepatitis C antibody  9. Screening for HIV (human immunodeficiency virus)  Z11.4 HIV Antibody (routine testing w rflx)   Plan lab  patient reports that cough   is not from med or other illness but long term allergy Patient Care Team: Talayeh Bruinsma, Standley Brooking, MD as PCP - General (Internal Medicine) Karie Chimera, MD as Consulting  Physician (Neurosurgery) Patient Instructions  Continue  Attention lifestyle intervention healthy eating and exercise . Will notify you  of labs when available.  bp goal 120/80 but 130 /80 ok  If all ok then yearly cpx  No major change in health status since last visit  Preventive Care 73-40 Years Old, Female Preventive care refers to visits with your health care provider and lifestyle choices that can promote health and wellness. This includes:  A yearly physical exam. This may also be called an annual well check.  Regular dental visits and eye exams.  Immunizations.  Screening for certain conditions.  Healthy lifestyle choices, such as eating a healthy diet, getting regular exercise, not using drugs or products that contain nicotine and tobacco, and limiting alcohol use. What  can I expect for my preventive care visit? Physical exam Your health care provider will check your:  Height and weight. This may be used to calculate body mass index (BMI), which tells if you are at a healthy weight.  Heart rate and blood pressure.  Skin for abnormal spots. Counseling Your health care provider may ask you questions about your:  Alcohol, tobacco, and drug use.  Emotional well-being.  Home and relationship well-being.  Sexual activity.  Eating habits.  Work and work Statistician.  Method of birth control.  Menstrual cycle.  Pregnancy history. What immunizations do I need?  Influenza (flu) vaccine  This is recommended every year. Tetanus, diphtheria, and pertussis (Tdap) vaccine  You may need a Td booster every 10 years. Varicella (chickenpox) vaccine  You may need this if you have not been vaccinated. Zoster (shingles) vaccine  You may need this after age 62. Measles, mumps, and rubella (MMR) vaccine  You may need at least one dose of MMR if you were born in 1957 or later. You may also need a second dose. Pneumococcal conjugate (PCV13) vaccine  You may need this if you have certain conditions and were not previously vaccinated. Pneumococcal polysaccharide (PPSV23) vaccine  You may need one or two doses if you smoke cigarettes or if you have certain conditions. Meningococcal conjugate (MenACWY) vaccine  You may need this if you have certain conditions. Hepatitis A vaccine  You may need this if you have certain conditions or if you travel or work in places where you may be exposed to hepatitis A. Hepatitis B vaccine  You may need this if you have certain conditions or if you travel or work in places where you may be exposed to hepatitis B. Haemophilus influenzae type b (Hib) vaccine  You may need this if you have certain conditions. Human papillomavirus (HPV) vaccine  If recommended by your health care provider, you may need three doses  over 6 months. You may receive vaccines as individual doses or as more than one vaccine together in one shot (combination vaccines). Talk with your health care provider about the risks and benefits of combination vaccines. What tests do I need? Blood tests  Lipid and cholesterol levels. These may be checked every 5 years, or more frequently if you are over 60 years old.  Hepatitis C test.  Hepatitis B test. Screening  Lung cancer screening. You may have this screening every year starting at age 2 if you have a 30-pack-year history of smoking and currently smoke or have quit within the past 15 years.  Colorectal cancer screening. All adults should have this screening starting at age 52 and continuing until age 61. Your health care provider may recommend screening at  age 1 if you are at increased risk. You will have tests every 1-10 years, depending on your results and the type of screening test.  Diabetes screening. This is done by checking your blood sugar (glucose) after you have not eaten for a while (fasting). You may have this done every 1-3 years.  Mammogram. This may be done every 1-2 years. Talk with your health care provider about when you should start having regular mammograms. This may depend on whether you have a family history of breast cancer.  BRCA-related cancer screening. This may be done if you have a family history of breast, ovarian, tubal, or peritoneal cancers.  Pelvic exam and Pap test. This may be done every 3 years starting at age 75. Starting at age 23, this may be done every 5 years if you have a Pap test in combination with an HPV test. Other tests  Sexually transmitted disease (STD) testing.  Bone density scan. This is done to screen for osteoporosis. You may have this scan if you are at high risk for osteoporosis. Follow these instructions at home: Eating and drinking  Eat a diet that includes fresh fruits and vegetables, whole grains, lean protein, and  low-fat dairy.  Take vitamin and mineral supplements as recommended by your health care provider.  Do not drink alcohol if: ? Your health care provider tells you not to drink. ? You are pregnant, may be pregnant, or are planning to become pregnant.  If you drink alcohol: ? Limit how much you have to 0-1 drink a day. ? Be aware of how much alcohol is in your drink. In the [place] one drink equals one 12 oz bottle of beer (355 mL), one 5 oz glass of wine (148 mL), or one 1 oz glass of hard liquor (44 mL). Lifestyle  Take daily care of your teeth and gums.  Stay active. Exercise for at least 30 minutes on 5 or more days each week.  Do not use any products that contain nicotine or tobacco, such as cigarettes, e-cigarettes, and chewing tobacco. If you need help quitting, ask your health care provider.  If you are sexually active, practice safe sex. Use a condom or other form of birth control (contraception) in order to prevent pregnancy and STIs (sexually transmitted infections).  If told by your health care provider, take low-dose aspirin daily starting at age 43. What's next?  Visit your health care provider once a year for a well check visit.  Ask your health care provider how often you should have your eyes and teeth checked.  Stay up to date on all vaccines. This information is not intended to replace advice given to you by your health care provider. Make sure you discuss any questions you have with your health care provider. Document Released: 02/25/2015 Document Revised: 10/10/2017 Document Reviewed: 10/10/2017 Elsevier Patient Education  2020 Ullin Aven Christen M.D.

## 2018-08-13 ENCOUNTER — Other Ambulatory Visit: Payer: Self-pay

## 2018-08-13 ENCOUNTER — Ambulatory Visit (INDEPENDENT_AMBULATORY_CARE_PROVIDER_SITE_OTHER): Payer: BC Managed Care – PPO | Admitting: Internal Medicine

## 2018-08-13 ENCOUNTER — Encounter: Payer: Self-pay | Admitting: Internal Medicine

## 2018-08-13 VITALS — BP 138/82 | HR 78 | Temp 98.4°F | Ht 62.25 in | Wt 170.8 lb

## 2018-08-13 DIAGNOSIS — R7301 Impaired fasting glucose: Secondary | ICD-10-CM

## 2018-08-13 DIAGNOSIS — Z833 Family history of diabetes mellitus: Secondary | ICD-10-CM

## 2018-08-13 DIAGNOSIS — Z79899 Other long term (current) drug therapy: Secondary | ICD-10-CM

## 2018-08-13 DIAGNOSIS — I10 Essential (primary) hypertension: Secondary | ICD-10-CM

## 2018-08-13 DIAGNOSIS — Z0000 Encounter for general adult medical examination without abnormal findings: Secondary | ICD-10-CM

## 2018-08-13 DIAGNOSIS — R739 Hyperglycemia, unspecified: Secondary | ICD-10-CM

## 2018-08-13 DIAGNOSIS — E785 Hyperlipidemia, unspecified: Secondary | ICD-10-CM

## 2018-08-13 DIAGNOSIS — Z114 Encounter for screening for human immunodeficiency virus [HIV]: Secondary | ICD-10-CM

## 2018-08-13 DIAGNOSIS — Z1159 Encounter for screening for other viral diseases: Secondary | ICD-10-CM

## 2018-08-13 LAB — CBC WITH DIFFERENTIAL/PLATELET
Basophils Absolute: 0 10*3/uL (ref 0.0–0.1)
Basophils Relative: 1.1 % (ref 0.0–3.0)
Eosinophils Absolute: 0.1 10*3/uL (ref 0.0–0.7)
Eosinophils Relative: 1.9 % (ref 0.0–5.0)
HCT: 35 % — ABNORMAL LOW (ref 36.0–46.0)
Hemoglobin: 10.9 g/dL — ABNORMAL LOW (ref 12.0–15.0)
Lymphocytes Relative: 32.2 % (ref 12.0–46.0)
Lymphs Abs: 1.5 10*3/uL (ref 0.7–4.0)
MCHC: 31.3 g/dL (ref 30.0–36.0)
MCV: 80 fl (ref 78.0–100.0)
Monocytes Absolute: 0.3 10*3/uL (ref 0.1–1.0)
Monocytes Relative: 7.7 % (ref 3.0–12.0)
Neutro Abs: 2.6 10*3/uL (ref 1.4–7.7)
Neutrophils Relative %: 57.1 % (ref 43.0–77.0)
Platelets: 450 10*3/uL — ABNORMAL HIGH (ref 150.0–400.0)
RBC: 4.37 Mil/uL (ref 3.87–5.11)
RDW: 15.9 % — ABNORMAL HIGH (ref 11.5–15.5)
WBC: 4.6 10*3/uL (ref 4.0–10.5)

## 2018-08-13 LAB — BASIC METABOLIC PANEL
BUN: 10 mg/dL (ref 6–23)
CO2: 27 mEq/L (ref 19–32)
Calcium: 9.1 mg/dL (ref 8.4–10.5)
Chloride: 101 mEq/L (ref 96–112)
Creatinine, Ser: 0.69 mg/dL (ref 0.40–1.20)
GFR: 87.94 mL/min (ref >60.00)
Glucose, Bld: 95 mg/dL (ref 70–99)
Potassium: 4.4 mEq/L (ref 3.5–5.1)
Sodium: 137 mEq/L (ref 135–145)

## 2018-08-13 LAB — HEPATIC FUNCTION PANEL
ALT: 18 U/L (ref 0–35)
AST: 21 U/L (ref 0–37)
Albumin: 4.4 g/dL (ref 3.5–5.2)
Alkaline Phosphatase: 73 U/L (ref 39–117)
Bilirubin, Direct: 0.1 mg/dL (ref 0.0–0.3)
Total Bilirubin: 0.3 mg/dL (ref 0.2–1.2)
Total Protein: 7 g/dL (ref 6.0–8.3)

## 2018-08-13 LAB — HEMOGLOBIN A1C: Hgb A1c MFr Bld: 6 % (ref 4.6–6.5)

## 2018-08-13 LAB — TSH: TSH: 2.01 u[IU]/mL (ref 0.35–4.50)

## 2018-08-13 NOTE — Patient Instructions (Signed)
Continue  Attention lifestyle intervention healthy eating and exercise . Will notify you  of labs when available.  bp goal 120/80 but 130 /80 ok  If all ok then yearly cpx  No major change in health status since last visit  Preventive Care 26-56 Years Old, Female Preventive care refers to visits with your health care provider and lifestyle choices that can promote health and wellness. This includes:  A yearly physical exam. This may also be called an annual well check.  Regular dental visits and eye exams.  Immunizations.  Screening for certain conditions.  Healthy lifestyle choices, such as eating a healthy diet, getting regular exercise, not using drugs or products that contain nicotine and tobacco, and limiting alcohol use. What can I expect for my preventive care visit? Physical exam Your health care provider will check your:  Height and weight. This may be used to calculate body mass index (BMI), which tells if you are at a healthy weight.  Heart rate and blood pressure.  Skin for abnormal spots. Counseling Your health care provider may ask you questions about your:  Alcohol, tobacco, and drug use.  Emotional well-being.  Home and relationship well-being.  Sexual activity.  Eating habits.  Work and work Statistician.  Method of birth control.  Menstrual cycle.  Pregnancy history. What immunizations do I need?  Influenza (flu) vaccine  This is recommended every year. Tetanus, diphtheria, and pertussis (Tdap) vaccine  You may need a Td booster every 10 years. Varicella (chickenpox) vaccine  You may need this if you have not been vaccinated. Zoster (shingles) vaccine  You may need this after age 75. Measles, mumps, and rubella (MMR) vaccine  You may need at least one dose of MMR if you were born in 1957 or later. You may also need a second dose. Pneumococcal conjugate (PCV13) vaccine  You may need this if you have certain conditions and were not  previously vaccinated. Pneumococcal polysaccharide (PPSV23) vaccine  You may need one or two doses if you smoke cigarettes or if you have certain conditions. Meningococcal conjugate (MenACWY) vaccine  You may need this if you have certain conditions. Hepatitis A vaccine  You may need this if you have certain conditions or if you travel or work in places where you may be exposed to hepatitis A. Hepatitis B vaccine  You may need this if you have certain conditions or if you travel or work in places where you may be exposed to hepatitis B. Haemophilus influenzae type b (Hib) vaccine  You may need this if you have certain conditions. Human papillomavirus (HPV) vaccine  If recommended by your health care provider, you may need three doses over 6 months. You may receive vaccines as individual doses or as more than one vaccine together in one shot (combination vaccines). Talk with your health care provider about the risks and benefits of combination vaccines. What tests do I need? Blood tests  Lipid and cholesterol levels. These may be checked every 5 years, or more frequently if you are over 51 years old.  Hepatitis C test.  Hepatitis B test. Screening  Lung cancer screening. You may have this screening every year starting at age 63 if you have a 30-pack-year history of smoking and currently smoke or have quit within the past 15 years.  Colorectal cancer screening. All adults should have this screening starting at age 38 and continuing until age 42. Your health care provider may recommend screening at age 64 if you are at  increased risk. You will have tests every 1-10 years, depending on your results and the type of screening test.  Diabetes screening. This is done by checking your blood sugar (glucose) after you have not eaten for a while (fasting). You may have this done every 1-3 years.  Mammogram. This may be done every 1-2 years. Talk with your health care provider about when you  should start having regular mammograms. This may depend on whether you have a family history of breast cancer.  BRCA-related cancer screening. This may be done if you have a family history of breast, ovarian, tubal, or peritoneal cancers.  Pelvic exam and Pap test. This may be done every 3 years starting at age 37. Starting at age 66, this may be done every 5 years if you have a Pap test in combination with an HPV test. Other tests  Sexually transmitted disease (STD) testing.  Bone density scan. This is done to screen for osteoporosis. You may have this scan if you are at high risk for osteoporosis. Follow these instructions at home: Eating and drinking  Eat a diet that includes fresh fruits and vegetables, whole grains, lean protein, and low-fat dairy.  Take vitamin and mineral supplements as recommended by your health care provider.  Do not drink alcohol if: ? Your health care provider tells you not to drink. ? You are pregnant, may be pregnant, or are planning to become pregnant.  If you drink alcohol: ? Limit how much you have to 0-1 drink a day. ? Be aware of how much alcohol is in your drink. In the [place] one drink equals one 12 oz bottle of beer (355 mL), one 5 oz glass of wine (148 mL), or one 1 oz glass of hard liquor (44 mL). Lifestyle  Take daily care of your teeth and gums.  Stay active. Exercise for at least 30 minutes on 5 or more days each week.  Do not use any products that contain nicotine or tobacco, such as cigarettes, e-cigarettes, and chewing tobacco. If you need help quitting, ask your health care provider.  If you are sexually active, practice safe sex. Use a condom or other form of birth control (contraception) in order to prevent pregnancy and STIs (sexually transmitted infections).  If told by your health care provider, take low-dose aspirin daily starting at age 42. What's next?  Visit your health care provider once a year for a well check visit.   Ask your health care provider how often you should have your eyes and teeth checked.  Stay up to date on all vaccines. This information is not intended to replace advice given to you by your health care provider. Make sure you discuss any questions you have with your health care provider. Document Released: 02/25/2015 Document Revised: 10/10/2017 Document Reviewed: 10/10/2017 Elsevier Patient Education  2020 [employer].

## 2018-08-14 DIAGNOSIS — E785 Hyperlipidemia, unspecified: Secondary | ICD-10-CM

## 2018-08-14 DIAGNOSIS — D649 Anemia, unspecified: Secondary | ICD-10-CM

## 2018-08-14 LAB — LIPID PANEL
Cholesterol: 253 mg/dL — ABNORMAL HIGH (ref 0–200)
HDL: 80.6 mg/dL (ref >39.00)
LDL Cholesterol: 152 mg/dL — ABNORMAL HIGH (ref 0–99)
NonHDL: 172.4
Total CHOL/HDL Ratio: 3
Triglycerides: 102 mg/dL (ref 0.0–149.0)
VLDL: 20.4 mg/dL (ref 0.0–40.0)

## 2018-08-14 LAB — HEPATITIS C ANTIBODY
Hepatitis C Ab: NONREACTIVE
SIGNAL TO CUT-OFF: 0.01 (ref <1.00)

## 2018-08-14 LAB — HIV ANTIBODY (ROUTINE TESTING W REFLEX): HIV 1&2 Ab, 4th Generation: NONREACTIVE

## 2018-08-17 ENCOUNTER — Other Ambulatory Visit: Payer: Self-pay | Admitting: Internal Medicine

## 2018-08-21 NOTE — Telephone Encounter (Signed)
So  Get "anemia"  labs  In the next 3- weeks for anemia check. And iron levels .   Would recheck lipids in 3-4 months on  Statin medication.   Jacqueline Beasley I placed the anemia labs in the  System ( but no yet the lipid  Labs as this should be later)

## 2018-09-24 ENCOUNTER — Other Ambulatory Visit (INDEPENDENT_AMBULATORY_CARE_PROVIDER_SITE_OTHER): Payer: BC Managed Care – PPO

## 2018-09-24 ENCOUNTER — Other Ambulatory Visit: Payer: Self-pay

## 2018-09-24 DIAGNOSIS — D649 Anemia, unspecified: Secondary | ICD-10-CM | POA: Diagnosis not present

## 2018-09-24 DIAGNOSIS — E785 Hyperlipidemia, unspecified: Secondary | ICD-10-CM

## 2018-09-24 DIAGNOSIS — K921 Melena: Secondary | ICD-10-CM

## 2018-09-24 LAB — CBC WITH DIFFERENTIAL/PLATELET
Basophils Absolute: 0.1 10*3/uL (ref 0.0–0.1)
Basophils Relative: 0.9 % (ref 0.0–3.0)
Eosinophils Absolute: 0.1 10*3/uL (ref 0.0–0.7)
Eosinophils Relative: 1.7 % (ref 0.0–5.0)
HCT: 35.1 % — ABNORMAL LOW (ref 36.0–46.0)
Hemoglobin: 11.1 g/dL — ABNORMAL LOW (ref 12.0–15.0)
Lymphocytes Relative: 26.9 % (ref 12.0–46.0)
Lymphs Abs: 1.5 10*3/uL (ref 0.7–4.0)
MCHC: 31.7 g/dL (ref 30.0–36.0)
MCV: 79.1 fl (ref 78.0–100.0)
Monocytes Absolute: 0.5 10*3/uL (ref 0.1–1.0)
Monocytes Relative: 8.7 % (ref 3.0–12.0)
Neutro Abs: 3.4 10*3/uL (ref 1.4–7.7)
Neutrophils Relative %: 61.8 % (ref 43.0–77.0)
Platelets: 368 10*3/uL (ref 150.0–400.0)
RBC: 4.43 Mil/uL (ref 3.87–5.11)
RDW: 16.1 % — ABNORMAL HIGH (ref 11.5–15.5)
WBC: 5.5 10*3/uL (ref 4.0–10.5)

## 2018-09-24 LAB — POC URINALSYSI DIPSTICK (AUTOMATED)
Blood, UA: NEGATIVE
Glucose, UA: NEGATIVE
Ketones, UA: NEGATIVE
Leukocytes, UA: NEGATIVE
Nitrite, UA: NEGATIVE
Protein, UA: POSITIVE — AB
Spec Grav, UA: 1.02 (ref 1.010–1.025)
Urobilinogen, UA: 0.2 E.U./dL
pH, UA: 6 (ref 5.0–8.0)

## 2018-09-24 LAB — IBC + FERRITIN
Ferritin: 7.2 ng/mL — ABNORMAL LOW (ref 10.0–291.0)
Iron: 69 ug/dL (ref 42–145)
Saturation Ratios: 13.2 % — ABNORMAL LOW (ref 20.0–50.0)
Transferrin: 373 mg/dL — ABNORMAL HIGH (ref 212.0–360.0)

## 2018-09-25 LAB — RETICULOCYTES
ABS Retic: 67500 cells/uL (ref 20000–8000)
Retic Ct Pct: 1.5 %

## 2018-09-25 NOTE — Progress Notes (Signed)
Cardiology Office Note   Date:  09/26/2018   ID:  Jacqueline Beasley, DOB 24-May-1960, MRN [id number]  PCP:  Burnis Medin, MD  Cardiologist:   Minus Breeding, MD  Referring:  Burnis Medin, MD  Chief Complaint  Patient presents with  . Elevated Coronary Calcium      History of Present Illness: Jacqueline Beasley is a 58 y.o. female who presents for evaluation of chest pain.  At the first appt I performed a POET (Plain Old Exercise Treadmill).  She exercised for 11 minutes without any ischemic ST T wave changes.  She had a negative POET (Plain Old Exercise Treadmill) last month.   She had an excellent exercise tolerance.    Since I saw her she is done well.  She remains active.  She walks briskly for exercise. The patient denies any new symptoms such as chest discomfort, neck or arm discomfort. There has been no new shortness of breath, PND or orthopnea. There have been no reported palpitations, presyncope or syncope.   Past Medical History:  Diagnosis Date  . Allergy    seasonal  . 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 Back pain    Recurrent has seen neurosurgery in past  . History of anal fissures    pt has itching with this at times  . Hx of varicella   . Hypertension     Past Surgical History:  Procedure Laterality Date  . ABDOMINAL HYSTERECTOMY     total with bso  for fibroids.   . AUGMENTATION MAMMAPLASTY    . BREAST ENHANCEMENT SURGERY  2010  . CESAREAN SECTION  8110,3159     Current Outpatient Medications  Medication Sig Dispense Refill  . Cetirizine HCl (ZYRTEC ALLERGY) 10 MG CAPS Take 10 mg by mouth as needed. Reported on 03/23/2015    . cholecalciferol (VITAMIN D) 1000 UNITS tablet Take 2,000 Units by mouth daily.    . diphenhydrAMINE (BENADRYL) 25 MG tablet Take 25 mg by mouth every 6 (six) hours as needed.    Marland Kitchen estradiol (CLIMARA - DOSED IN MG/24 HR) 0.075 mg/24hr patch PLACE 1 PATCH (0.075 MG TOTAL) ONTO THE SKIN ONCE A WEEK. 12 patch 1  . fluticasone (FLONASE) 50  MCG/ACT nasal spray Place 2 sprays into both nostrils daily.    Marland Kitchen lisinopril (ZESTRIL) 20 MG tablet TAKE 1 TABLET BY MOUTH EVERY DAY 90 tablet 3  . pantoprazole (PROTONIX) 40 MG tablet TAKE 1 TABLET BY MOUTH EVERY DAY 90 tablet 0  . VITAMIN E PO Take by mouth.    . ezetimibe (ZETIA) 10 MG tablet Take 1 tablet (10 mg total) by mouth daily. 90 tablet 3  . pravastatin (PRAVACHOL) 20 MG tablet TAKE 1 TABLET BY MOUTH 3 TIMES A WEEK 40 tablet 3   No current facility-administered medications for this visit.     Allergies:   Ambien [zolpidem]    ROS:  Please see the history of present illness.   Otherwise, review of systems are positive for none.   All other systems are reviewed and negative.    PHYSICAL EXAM: VS:  BP 118/84   Pulse 84   Temp 97.6 F (36.4 C)   Ht 5\' 2"  (1.575 m)   Wt 170 lb 6.4 oz (77.3 kg)   SpO2 99%   BMI 31.17 kg/m  , BMI Body mass index is 31.17 kg/m.  GENERAL:  Well appearing NECK:  No jugular venous distention, waveform within normal limits,  carotid upstroke brisk and symmetric, no bruits, no thyromegaly LUNGS:  Clear to auscultation bilaterally CHEST:  Unremarkable HEART:  PMI not displaced or sustained,S1 and S2 within normal limits, no S3, no S4, no clicks, no rubs, no murmurs ABD:  Flat, positive bowel sounds normal in frequency in pitch, no bruits, no rebound, no guarding, no midline pulsatile mass, no hepatomegaly, no splenomegaly EXT:  2 plus pulses throughout, no edema, no cyanosis no clubbing   EKG:  EKG is ordered today. Sinus rhythm, rate 85, axis within normal limits, intervals within normal limits, no acute ST-T wave changes.   Recent Labs: 08/13/2018: ALT 18; BUN 10; Creatinine, Ser 0.69; Potassium 4.4; Sodium 137; TSH 2.01 09/24/2018: Hemoglobin 11.1; Platelets 368.0    Lipid Panel    Component Value Date/Time   CHOL 253 (H) 08/13/2018 1106   TRIG 102.0 08/13/2018 1106   HDL 80.60 08/13/2018 1106   CHOLHDL 3 08/13/2018 1106   VLDL 20.4  08/13/2018 1106   LDLCALC 152 (H) 08/13/2018 1106   LDLDIRECT 149.9 03/03/2013 1106      Wt Readings from Last 3 Encounters:  09/26/18 170 lb 6.4 oz (77.3 kg)  08/13/18 170 lb 12.8 oz (77.5 kg)  04/23/18 172 lb 1.6 oz (78.1 kg)      Other studies Reviewed: Additional studies/ records that were reviewed today include: None Review of the above records demonstrates:       ASSESSMENT AND PLAN:  ELEVATED CORONARY CALCIUM:     Remains very aggressive with risk reduction given her coronary calcium is at young age.  I do not think she needs further testing at this point but I probably would do a POET (Plain Old Exercise Treadmill) again next year.  DYSLIPIDEMIA:   Her LDL actually went up to 152.  She does not tolerate statins for the most part.  I am going asked her to try to take 20 mg of Pravachol 3 times a week which would be the most she can do and then again add Zetia 10 mg daily.  In 8 weeks she will get a lipid profile and she is not at target with a goal of at least LDL less than 110 I would suggest PCSK9 inhibitor.  HTN:  The blood pressure is at target.  She will continue the meds as listed.   Current medicines are reviewed at length with the patient today.  The patient does not have concerns regarding medicines.  The following changes have been made:   As above.  Labs/ tests ordered today include:     Orders Placed This Encounter  Procedures  . Lipid panel  . EKG 12-Lead     Disposition:   FU with me after in one year. Ronnell Guadalajara, MD  09/26/2018 10:49 AM    [place]

## 2018-09-26 ENCOUNTER — Other Ambulatory Visit: Payer: Self-pay

## 2018-09-26 ENCOUNTER — Ambulatory Visit: Payer: BC Managed Care – PPO | Admitting: Cardiology

## 2018-09-26 ENCOUNTER — Encounter: Payer: Self-pay | Admitting: Cardiology

## 2018-09-26 VITALS — BP 118/84 | HR 84 | Temp 97.6°F | Ht 62.0 in | Wt 170.4 lb

## 2018-09-26 DIAGNOSIS — E785 Hyperlipidemia, unspecified: Secondary | ICD-10-CM | POA: Diagnosis not present

## 2018-09-26 DIAGNOSIS — I2584 Coronary atherosclerosis due to calcified coronary lesion: Secondary | ICD-10-CM | POA: Diagnosis not present

## 2018-09-26 DIAGNOSIS — I10 Essential (primary) hypertension: Secondary | ICD-10-CM

## 2018-09-26 DIAGNOSIS — Z5181 Encounter for therapeutic drug level monitoring: Secondary | ICD-10-CM | POA: Diagnosis not present

## 2018-09-26 DIAGNOSIS — I2510 Atherosclerotic heart disease of native coronary artery without angina pectoris: Secondary | ICD-10-CM | POA: Diagnosis not present

## 2018-09-26 MED ORDER — PRAVASTATIN SODIUM 20 MG PO TABS: ORAL_TABLET | ORAL | 3 refills | Status: DC

## 2018-09-26 MED ORDER — EZETIMIBE 10 MG PO TABS: 10.0000 mg | ORAL_TABLET | Freq: Every day | ORAL | 3 refills | Status: DC

## 2018-09-26 NOTE — Patient Instructions (Addendum)
Medication Instructions:  START ZETIA 10 MG DAILY   DECREASE YOUR PRAVASTATIN TO 20 MG 3 TIMES A WEEK  If you need a refill on your cardiac medications before your next appointment, please call your pharmacy.   Lab work: FASTING LP IN 8 WEEKS  If you have labs (blood work) drawn today and your tests are completely normal, you will receive your results only by: Marland Kitchen MyChart Message (if you have MyChart) OR . A paper copy in the mail If you have any lab test that is abnormal or we need to change your treatment, we will call you to review the results.  Testing/Procedures: NONE  Follow-Up: At [facility], you and your health needs are our priority.  As part of our continuing mission to provide you with exceptional heart care, we have created designated Provider Care Teams.  These Care Teams include your primary Cardiologist (physician) and Advanced Practice Providers (APPs -  Physician Assistants and Nurse Practitioners) who all work together to provide you with the care you need, when you need it. You will need a follow up appointment in 12 months.  Please call our office 2 months in advance to schedule this appointment.  You may see [facility]  or one of the following Advanced Practice Providers on your designated Care Team:   Rosaria Ferries, PA-C . Jory Sims, DNP, ANP  Your physician recommends that you schedule a follow-up appointment in: [place]

## 2018-10-14 ENCOUNTER — Telehealth: Payer: Self-pay

## 2018-10-14 ENCOUNTER — Ambulatory Visit: Payer: BC Managed Care – PPO | Admitting: Gastroenterology

## 2018-10-14 NOTE — Telephone Encounter (Signed)
Prior Auth for Rx Ezetimibe 10mg  Tab is approved through 09/28/2019

## 2018-10-22 ENCOUNTER — Ambulatory Visit: Payer: BC Managed Care – PPO | Admitting: Physician Assistant

## 2018-10-22 ENCOUNTER — Encounter: Payer: Self-pay | Admitting: Physician Assistant

## 2018-10-22 ENCOUNTER — Other Ambulatory Visit: Payer: Self-pay

## 2018-10-22 VITALS — BP 100/80 | HR 83 | Temp 98.1°F | Ht 62.0 in | Wt 171.4 lb

## 2018-10-22 DIAGNOSIS — R1013 Epigastric pain: Secondary | ICD-10-CM

## 2018-10-22 DIAGNOSIS — G8929 Other chronic pain: Secondary | ICD-10-CM | POA: Diagnosis not present

## 2018-10-22 DIAGNOSIS — K5730 Diverticulosis of large intestine without perforation or abscess without bleeding: Secondary | ICD-10-CM

## 2018-10-22 DIAGNOSIS — D509 Iron deficiency anemia, unspecified: Secondary | ICD-10-CM | POA: Diagnosis not present

## 2018-10-22 MED ORDER — NA SULFATE-K SULFATE-MG SULF 17.5-3.13-1.6 GM/177ML PO SOLN: ORAL | 0 refills | Status: DC

## 2018-10-22 NOTE — Progress Notes (Signed)
Subjective:    Patient ID: Jacqueline Beasley, female    DOB: 07/04/1960, 58 y.o.   MRN: [id number]  HPI Jacqueline Beasley is a pleasant 58 year old female, known to Dr. Hilarie Fredrickson from prior colonoscopy who is referred back today by primary care/Dr. Regis Bill for evaluation of iron deficiency anemia. Patient has history of hypertension, hyperlipidemia and is status post complete hysterectomy several years ago. She had colonoscopy in February 2015 with finding of mild diverticulosis, and otherwise was negative exam. She says she was told she was anemic several years ago prior to her hysterectomy but has not had any problems since then. She has history of chronic GERD by her report and has been on Protonix 40 mg p.o. every morning over the past few years.  She says generally this works well. She had had recent labs done and was found to have a hemoglobin of 10.9 about 2 months ago.  Repeat labs on 09/24/2018 showed hemoglobin 11.1 hematocrit of 35, serum iron 69 ferritin 7.2, iron saturation 13.2 and transferrin 373. She is started on an over-the-counter plant-based iron supplement once daily. She reports symptoms of "stomachache" which was waking her at night intermittently over the past few months.  She was also having some cramping and somewhat loose stools which she questions may be secondary to some new medication for elevated cholesterol.  She has noticed her stools dark on occasion though has not seen any overt melena or hematochezia.  She had been taking some Pepto-Bismol as needed.  She feels that over the past couple of weeks her abdomen has been feeling better and has not really been bothering her. No regular NSAID use, no EtOH. Family history is negative for GI disease as far she is aware.  Review of Systems Pertinent positive and negative review of systems were noted in the above HPI section.  All other review of systems was otherwise negative.  Outpatient Encounter Medications as of 10/22/2018  Medication  Sig  . Cetirizine HCl (ZYRTEC ALLERGY) 10 MG CAPS Take 10 mg by mouth as needed. Reported on 03/23/2015  . cholecalciferol (VITAMIN D) 1000 UNITS tablet Take 2,000 Units by mouth daily.  . diphenhydrAMINE (BENADRYL) 25 MG tablet Take 25 mg by mouth every 6 (six) hours as needed.  Marland Kitchen estradiol (CLIMARA - DOSED IN MG/24 HR) 0.075 mg/24hr patch PLACE 1 PATCH (0.075 MG TOTAL) ONTO THE SKIN ONCE A WEEK.  . ezetimibe (ZETIA) 10 MG tablet Take 1 tablet (10 mg total) by mouth daily.  . Ferrous Gluconate-C-Folic Acid (IRON-C PO) Take 26 mg by mouth daily.  . fluticasone (FLONASE) 50 MCG/ACT nasal spray Place 2 sprays into both nostrils daily.  Marland Kitchen lisinopril (ZESTRIL) 20 MG tablet TAKE 1 TABLET BY MOUTH EVERY DAY  . pantoprazole (PROTONIX) 40 MG tablet TAKE 1 TABLET BY MOUTH EVERY DAY  . pravastatin (PRAVACHOL) 20 MG tablet TAKE 1 TABLET BY MOUTH 3 TIMES A WEEK  . VITAMIN E PO Take by mouth.  . Na Sulfate-K Sulfate-Mg Sulf 17.5-3.13-1.6 GM/177ML SOLN Suprep-Use as directed   No facility-administered encounter medications on file as of 10/22/2018.    Allergies  Allergen Reactions  . Ambien [Zolpidem] Other (See Comments)    Amnesia    Patient Active Problem List   Diagnosis Date Noted  . Diverticulosis of colon without hemorrhage 10/22/2018  . Influenza vaccination declined by patient 01/02/2014  . Postmenopausal HRT (hormone replacement therapy) 01/02/2014  . Hyperglycemia 03/03/2013  . Hyperlipidemia 03/03/2013  . Fasting hyperglycemia 12/30/2012  . Family  history of diabetes mellitus 12/30/2012  . Other and unspecified hyperlipidemia 12/30/2012  . Essential hypertension 12/30/2012  . H/O hysterectomy for benign disease 02/04/2012  . Surgical menopause  2007 02/04/2012   Social History   Socioeconomic History  . Marital status: Married    Spouse name: Not on file  . Number of children: 2  . Years of education: Not on file  . Highest education level: Not on file  Occupational History  .  Not on file  Social Needs  . Financial resource strain: Not on file  . Food insecurity    Worry: Not on file    Inability: Not on file  . Transportation needs    Medical: Not on file    Non-medical: Not on file  Tobacco Use  . Smoking status: Never Smoker  . Smokeless tobacco: Never Used  Substance and Sexual Activity  . Alcohol use: Yes    Alcohol/week: 7.0 standard drinks    Types: 7 Glasses of wine per week    Comment: 1 glass of wine a night  . Drug use: No  . Sexual activity: Not on file  Lifestyle  . Physical activity    Days per week: Not on file    Minutes per session: Not on file  . Stress: Not on file  Relationships  . Social Herbalist on phone: Not on file    Gets together: Not on file    Attends religious service: Not on file    Active member of club or organization: Not on file    Attends meetings of clubs or organizations: Not on file    Relationship status: Not on file  . Intimate partner violence    Fear of current or ex partner: Not on file    Emotionally abused: Not on file    Physically abused: Not on file    Forced sexual activity: Not on file  Other Topics Concern  . Not on file  Social History Narrative   Lives at home with husband.   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 Delahunty's family history includes Cancer in her mother; Diabetes in her maternal grandmother; Hypertension in her mother and sister; Stroke in her mother.      Objective:    Vitals:   10/22/18 0831  BP: 100/80  Pulse: 83  Temp: 98.1 F (36.7 C)    Physical Exam;Well-developed well-nourished female in no acute distress, pleasantWeight 171, BMI 31.3  HEENT; nontraumatic normocephalic, EOMI, PE R LA, sclera anicteric. Oropharynx; not examined/wearing mask/COVID Neck; supple, no JVD Cardiovascular; regular rate and rhythm with S1-S2, no murmur rub or gallop  Pulmonary; Clear bilaterally Abdomen; soft, she is tender in the hypogastrium, no guarding or rebound, nondistended, no palpable mass or hepatosplenomegaly, bowel sounds are active Rectal; not done today Skin; benign exam, no jaundice rash or appreciable lesions Extremities; no clubbing cyanosis or edema skin warm and dry Neuro/Psych; alert and oriented x4, grossly nonfocal mood and affect appropriate       Assessment & Plan:   #23 58 year old female with new finding of iron deficiency anemia.  Patient is status post remote hysterectomy, and has been on chronic Protonix for GERD. She had been having some recent epigastric/hypogastric abdominal pain which was waking her at night over the past few months intermittently, which seems to have improved recently.  Etiology of iron deficiency anemia is not clear, suspect chronic intermittent GI blood loss, less likely malabsorption. Rule out occult neoplasm, rule out chronic gastropathy, AVMs, celiac disease  #2 chronic GERD-on Protonix 3.  Hyperlipidemia 4.  Hypertension 5.  Diverticulosis  Plan; Continue Protonix 40 mg p.o. every morning. Patient will be scheduled for upper endoscopy and colonoscopy with Dr. Hilarie Fredrickson.  Both procedures were discussed in detail with the patient including indications risks and benefits and she is agreeable to proceed. If endoscopic evaluation unremarkable will need capsule endoscopy which was also discussed with the patient today. She is scheduled to have follow-up labs with Dr. Regis Bill in 8 weeks. Further recommendations pending findings of above.  Wyndell Cardiff S Naylin Burkle PA-C 10/22/2018   Cc: Burnis Medin, MD

## 2018-10-22 NOTE — Patient Instructions (Signed)
If you are age 56 or older, your body mass index should be between 23-30. Your Body mass index is 31.34 kg/m. If this is out of the aforementioned range listed, please consider follow up with your Primary Care Provider.  If you are age 3 or younger, your body mass index should be between 19-25. Your Body mass index is 31.34 kg/m. If this is out of the aformentioned range listed, please consider follow up with your Primary Care Provider.   You have been scheduled for an endoscopy and colonoscopy. Please follow the written instructions given to you at your visit today. Please pick up your prep supplies at the pharmacy within the next 1-3 days. If you use inhalers (even only as needed), please bring them with you on the day of your procedure. Your physician has requested that you go to www.startemmi.com and enter the access code given to you at your visit today. This web site gives a general overview about your procedure. However, you should still follow specific instructions given to you by our office regarding your preparation for the procedure.   We have sent the following medications to your pharmacy for you to pick up at your convenience: Suprep  Continue Protonix 40 mg every morning.  Increase over-the-counter Iron to three times daily as tolerated.  HOLD IRON FIVE DAYS PRIOR TO PROCEDURE.  Thank you for choosing me and [facility].   Amy Esterwood, PA-C

## 2018-11-13 ENCOUNTER — Other Ambulatory Visit: Payer: Self-pay | Admitting: Internal Medicine

## 2018-11-14 ENCOUNTER — Other Ambulatory Visit: Payer: Self-pay | Admitting: Internal Medicine

## 2018-11-18 LAB — LIPID PANEL
Chol/HDL Ratio: 3 ratio (ref 0.0–4.4)
Cholesterol, Total: 199 mg/dL (ref 100–199)
HDL: 66 mg/dL (ref >39)
LDL Chol Calc (NIH): 112 mg/dL — ABNORMAL HIGH (ref 0–99)
Triglycerides: 121 mg/dL (ref 0–149)
VLDL Cholesterol Cal: 21 mg/dL (ref 5–40)

## 2018-11-20 ENCOUNTER — Telehealth: Payer: Self-pay

## 2018-11-20 DIAGNOSIS — E785 Hyperlipidemia, unspecified: Secondary | ICD-10-CM

## 2018-11-20 MED ORDER — PRAVASTATIN SODIUM 20 MG PO TABS: ORAL_TABLET | ORAL | 3 refills | Status: DC

## 2018-11-20 NOTE — Telephone Encounter (Signed)
Gave pt results and Dr. Rosezella [place] recommendation. Verbalized understanding. Put in order for labs and new medication. Routed to PCP.

## 2018-11-20 NOTE — Telephone Encounter (Signed)
-----   Message from Minus Breeding, MD sent at 11/19/2018  8:24 PM EDT ----- Her LDL is better.  Could she consider taking her Pravachol every day and repeat the lipids in 10 weeks.  Call Ms. Hassell Done with the results and send results to [place], Standley Brooking, MD

## 2018-11-21 ENCOUNTER — Telehealth: Payer: Self-pay

## 2018-11-21 NOTE — Telephone Encounter (Signed)
Clarified orders with pt. Verbalized understanding.

## 2018-11-24 ENCOUNTER — Other Ambulatory Visit: Payer: Self-pay

## 2018-11-24 ENCOUNTER — Ambulatory Visit (INDEPENDENT_AMBULATORY_CARE_PROVIDER_SITE_OTHER): Payer: BC Managed Care – PPO | Admitting: Pharmacist Clinician (PhC)/ Clinical Pharmacy Specialist

## 2018-11-24 DIAGNOSIS — E785 Hyperlipidemia, unspecified: Secondary | ICD-10-CM | POA: Diagnosis not present

## 2018-11-24 MED ORDER — ROSUVASTATIN CALCIUM 10 MG PO TABS: ORAL_TABLET | ORAL | 3 refills | Status: DC

## 2018-11-24 NOTE — Assessment & Plan Note (Signed)
Patient with hyperlipidemia, no ASCVD, not to goal on pravastatin 20 mg tiw and ezetimibe 10 mg daily.   She has not tried any other statin drugs in the past, but has been unable to increase pravastatin regularly due to problems with edema in her hands and face.  Will have her switch to rosuvastatin 10 mg three times weekly and continue with the ezetimibe.  We will have her repeat labs in 2 months, and if she is tolerating medication, but not to goal, we can increase that dose as tolerated.

## 2018-11-24 NOTE — Patient Instructions (Signed)
Stop pravastatin.   Start rosuvastatin 10 mg three times per week (Monday, Wednesday and Friday).  If you have any side effects or problems that would cause you to stop, please call us.  (Mayola Mcbain/Raquel at [phone]).   If you tolerate this well, we will repeat your labs in 2-3 months.  At that time we can consider dose increase or other options if not at goal.  Continue with ezetimibe 10 mg daily

## 2018-11-24 NOTE — Progress Notes (Signed)
11/25/2018 Jacqueline Beasley 10-25-1960 [id number]   HPI:  Jacqueline Beasley is a 58 y.o. female patient of Dr Percival Spanish, who presents today for a lipid clinic evaluation.  Her cardiac history is significant only for hypertension and hyperlipidemia.  Other health issues include diverticulosis and elevated blood sugar, though no diagnosis of DM at this time.    Today she is in the office to discuss her most recent cholesterol results.  Although she does not have any existing ASCVD, she has some significant family history of hypertensive heart disease.  She has only ever taken pravastatin and ezetimibe for her elevated LDL readings, and has not been at goal.    Current Medications: ezetimibe 10 mg qd, pravastatin 20 mg tiw  Cholesterol Goals: LDL < 100  Family history:  Mother massive stroke at 15 (hypertension); father died Lewey Body dementia  2 siblings, with hypertension difficult to control  Son with hypertension now 40. Started in high school   Diet: mostly home cooked foods, prefers fresh foods; not much red meat, more chicken and fish; snacks on nuts  Exercise:  Walks (brisk pace), yoga  Labs: 11/2018:  TC 199, TG 121, HDL 66, LDL 112 (pravastatin 20 mg tiw, ezetimibe 10 mg qd)   Current Outpatient Medications  Medication Sig Dispense Refill  . Ascorbic Acid (VITAMIN C) 100 MG tablet Take 100 mg by mouth daily.    . Cetirizine HCl (ZYRTEC ALLERGY) 10 MG CAPS Take 10 mg by mouth as needed. Reported on 03/23/2015    . cholecalciferol (VITAMIN D) 1000 UNITS tablet Take 2,000 Units by mouth daily.    . diphenhydrAMINE (BENADRYL) 25 MG tablet Take 25 mg by mouth every 6 (six) hours as needed.    Marland Kitchen estradiol (CLIMARA - DOSED IN MG/24 HR) 0.075 mg/24hr patch PLACE 1 PATCH (0.075 MG TOTAL) ONTO THE SKIN ONCE A WEEK. 12 patch 1  . ezetimibe (ZETIA) 10 MG tablet Take 1 tablet (10 mg total) by mouth daily. 90 tablet 3  . Ferrous Gluconate-C-Folic Acid (IRON-C PO) Take 26 mg by mouth daily.    .  fluticasone (FLONASE) 50 MCG/ACT nasal spray Place 2 sprays into both nostrils daily.    Marland Kitchen lisinopril (ZESTRIL) 20 MG tablet TAKE 1 TABLET BY MOUTH EVERY DAY 90 tablet 3  . pantoprazole (PROTONIX) 40 MG tablet TAKE 1 TABLET BY MOUTH EVERY DAY 90 tablet 0  . pravastatin (PRAVACHOL) 20 MG tablet Take one tablet daily. 90 tablet 3  . VITAMIN E PO Take by mouth.    . Na Sulfate-K Sulfate-Mg Sulf 17.5-3.13-1.6 GM/177ML SOLN Suprep-Use as directed 354 mL 0  . rosuvastatin (CRESTOR) 10 MG tablet Take up to 1 tablet daily as tolerated 30 tablet 3   No current facility-administered medications for this visit.     Allergies  Allergen Reactions  . Ambien [Zolpidem] Other (See Comments)    Amnesia     Past Medical History:  Diagnosis Date  . Allergy    seasonal  . 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 Back pain    Recurrent has seen neurosurgery in past  . History of anal fissures    pt has itching with this at times  . Hx of varicella   . Hypertension     Blood pressure 132/88, pulse 80, resp. rate 14, height 5\' 2"  (1.575 m), weight 171 lb 12.8 oz (77.9 kg), SpO2 99 %.   Hyperlipidemia Patient with hyperlipidemia, no ASCVD, not to goal on pravastatin  20 mg tiw and ezetimibe 10 mg daily.   She has not tried any other statin drugs in the past, but has been unable to increase pravastatin regularly due to problems with edema in her hands and face.  Will have her switch to rosuvastatin 10 mg three times weekly and continue with the ezetimibe.  We will have her repeat labs in 2 months, and if she is tolerating medication, but not to goal, we can increase that dose as tolerated.     Tommy Medal PharmD CPP [facility] [address] [place], [place] [address]

## 2018-11-26 ENCOUNTER — Encounter: Payer: Self-pay | Admitting: Internal Medicine

## 2018-11-28 ENCOUNTER — Telehealth: Payer: Self-pay | Admitting: Internal Medicine

## 2018-11-28 NOTE — Telephone Encounter (Signed)

## 2018-12-01 ENCOUNTER — Other Ambulatory Visit: Payer: Self-pay

## 2018-12-01 ENCOUNTER — Encounter: Payer: Self-pay | Admitting: Internal Medicine

## 2018-12-01 ENCOUNTER — Ambulatory Visit (AMBULATORY_SURGERY_CENTER): Payer: BC Managed Care – PPO | Admitting: Internal Medicine

## 2018-12-01 VITALS — BP 122/77 | HR 62 | Temp 97.6°F | Resp 13 | Ht 62.0 in | Wt 171.0 lb

## 2018-12-01 DIAGNOSIS — K317 Polyp of stomach and duodenum: Secondary | ICD-10-CM

## 2018-12-01 DIAGNOSIS — K2950 Unspecified chronic gastritis without bleeding: Secondary | ICD-10-CM | POA: Diagnosis not present

## 2018-12-01 DIAGNOSIS — K635 Polyp of colon: Secondary | ICD-10-CM

## 2018-12-01 DIAGNOSIS — K2980 Duodenitis without bleeding: Secondary | ICD-10-CM

## 2018-12-01 DIAGNOSIS — D509 Iron deficiency anemia, unspecified: Secondary | ICD-10-CM | POA: Diagnosis present

## 2018-12-01 DIAGNOSIS — D125 Benign neoplasm of sigmoid colon: Secondary | ICD-10-CM

## 2018-12-01 DIAGNOSIS — B9681 Helicobacter pylori [H. pylori] as the cause of diseases classified elsewhere: Secondary | ICD-10-CM

## 2018-12-01 MED ORDER — SODIUM CHLORIDE 0.9 % IV SOLN: 500.0000 mL | Freq: Once | INTRAVENOUS | Status: DC

## 2018-12-01 NOTE — Patient Instructions (Signed)
Please read handouts provided. Await pathology results. Continue present medications.      YOU HAD AN ENDOSCOPIC PROCEDURE TODAY AT THE [facility]:   Refer to the procedure report that was given to you for any specific questions about what was found during the examination.  If the procedure report does not answer your questions, please call your gastroenterologist to clarify.  If you requested that your care partner not be given the details of your procedure findings, then the procedure report has been included in a sealed envelope for you to review at your convenience later.  YOU SHOULD EXPECT: Some feelings of bloating in the abdomen. Passage of more gas than usual.  Walking can help get rid of the air that was put into your GI tract during the procedure and reduce the bloating. If you had a lower endoscopy (such as a colonoscopy or flexible sigmoidoscopy) you may notice spotting of blood in your stool or on the toilet paper. If you underwent a bowel prep for your procedure, you may not have a normal bowel movement for a few days.  Please Note:  You might notice some irritation and congestion in your nose or some drainage.  This is from the oxygen used during your procedure.  There is no need for concern and it should clear up in a day or so.  SYMPTOMS TO REPORT IMMEDIATELY:   Following lower endoscopy (colonoscopy or flexible sigmoidoscopy):  Excessive amounts of blood in the stool  Significant tenderness or worsening of abdominal pains  Swelling of the abdomen that is new, acute  Fever of 100F or higher   Following upper endoscopy (EGD)  Vomiting of blood or coffee ground material  New chest pain or pain under the shoulder blades  Painful or persistently difficult swallowing  New shortness of breath  Fever of 100F or higher  Black, tarry-looking stools  For urgent or emergent issues, a gastroenterologist can be reached at any hour by calling [phone].   DIET:  We do recommend a small meal at first, but then you may proceed to your regular diet.  Drink plenty of fluids but you should avoid alcoholic beverages for 24 hours.  ACTIVITY:  You should plan to take it easy for the rest of today and you should NOT DRIVE or use heavy machinery until tomorrow (because of the sedation medicines used during the test).    FOLLOW UP: Our staff will call the number listed on your records 48-72 hours following your procedure to check on you and address any questions or concerns that you may have regarding the information given to you following your procedure. If we do not reach you, we will leave a message.  We will attempt to reach you two times.  During this call, we will ask if you have developed any symptoms of COVID 19. If you develop any symptoms (ie: fever, flu-like symptoms, shortness of breath, cough etc.) before then, please call [phone].  If you test positive for Covid 19 in the 2 weeks post procedure, please call and report this information to us.    If any biopsies were taken you will be contacted by phone or by letter within the next 1-3 weeks.  Please call us at [phone] if you have not heard about the biopsies in 3 weeks.    SIGNATURES/CONFIDENTIALITY: You and/or your care partner have signed paperwork which will be entered into your electronic medical record.  These signatures attest to the fact   that that the information above on your After Visit Summary has been reviewed and is understood.  Full responsibility of the confidentiality of this discharge information lies with you and/or your care-partner.

## 2018-12-01 NOTE — Op Note (Signed)
[place] Patient Name: Jacqueline Beasley Procedure Date: 12/01/2018 1:30 PM MRN: [id number] Endoscopist: Jerene Bears , MD Age: 58 Referring MD:  Date of Birth: 22-Nov-1960 Gender: Female Account #: [id number] Procedure:                Colonoscopy Indications:              Iron deficiency anemia, normal screening                            colonoscopy (diverticulosis) 2015 Medicines:                Monitored Anesthesia Care Procedure:                Pre-Anesthesia Assessment:                           - Prior to the procedure, a History and Physical                            was performed, and patient medications and                            allergies were reviewed. The patient's tolerance of                            previous anesthesia was also reviewed. The risks                            and benefits of the procedure and the sedation                            options and risks were discussed with the patient.                            All questions were answered, and informed consent                            was obtained. Prior Anticoagulants: The patient has                            taken no previous anticoagulant or antiplatelet                            agents. ASA Grade Assessment: II - A patient with                            mild systemic disease. After reviewing the risks                            and benefits, the patient was deemed in                            satisfactory condition to undergo the procedure.  After obtaining informed consent, the colonoscope                            was passed under direct vision. Throughout the                            procedure, the patient's blood pressure, pulse, and                            oxygen saturations were monitored continuously. The                            Colonoscope was introduced through the anus and                            advanced to the terminal ileum. The  colonoscopy was                            performed without difficulty. The patient tolerated                            the procedure well. The quality of the bowel                            preparation was good. The terminal ileum, ileocecal                            valve, appendiceal orifice, and rectum were                            photographed. Scope In: 1:58:08 PM Scope Out: 2:16:35 PM Scope Withdrawal Time: 0 hours 10 minutes 51 seconds  Total Procedure Duration: 0 hours 18 minutes 27 seconds  Findings:                 The digital rectal exam was normal.                           The terminal ileum appeared normal.                           A 4 mm polyp was found in the sigmoid colon. The                            polyp was sessile. The polyp was removed with a                            cold snare. Resection and retrieval were complete.                           Multiple medium-mouthed diverticula were found in                            the hepatic flexure and ascending colon.  The retroflexed view of the distal rectum and anal                            verge was normal and showed no anal or rectal                            abnormalities. Complications:            No immediate complications. Estimated Blood Loss:     Estimated blood loss: none. Impression:               - The examined portion of the ileum was normal.                           - One 4 mm polyp in the sigmoid colon, removed with                            a cold snare. Resected and retrieved.                           - Diverticulosis at the hepatic flexure and in the                            ascending colon.                           - The distal rectum and anal verge are normal on                            retroflexion view. Recommendation:           - Patient has a contact number available for                            emergencies. The signs and symptoms of potential                             delayed complications were discussed with the                            patient. Return to normal activities tomorrow.                            Written discharge instructions were provided to the                            patient.                           - Resume previous diet.                           - Continue present medications.                           - Await pathology results.                           -  Repeat colonoscopy is recommended. The                            colonoscopy date will be determined after pathology                            results from today's exam become available for                            review.                           - If biopsy results from stomach/duodenum are                            unremarkable, then video capsule endoscopy is                            recommended to complete the evaluation of iron                            deficiency anemia. Jerene Bears, MD 12/01/2018 2:25:16 PM This report has been signed electronically.

## 2018-12-01 NOTE — Progress Notes (Signed)
PT taken to PACU. Monitors in place. VSS. Report given to RN. 

## 2018-12-01 NOTE — Progress Notes (Signed)
Called to room to assist during endoscopic procedure.  Patient ID and intended procedure confirmed with present staff. Received instructions for my participation in the procedure from the performing physician.  

## 2018-12-01 NOTE — Op Note (Signed)
[place] Patient Name: Tristi Economy Procedure Date: 12/01/2018 1:30 PM MRN: [id number] Endoscopist: Jerene Bears , MD Age: 58 Referring MD:  Date of Birth: 27-Jul-1960 Gender: Female Account #: [id number] Procedure:                Upper GI endoscopy Indications:              Iron deficiency anemia Medicines:                Monitored Anesthesia Care Procedure:                Pre-Anesthesia Assessment:                           - Prior to the procedure, a History and Physical                            was performed, and patient medications and                            allergies were reviewed. The patient's tolerance of                            previous anesthesia was also reviewed. The risks                            and benefits of the procedure and the sedation                            options and risks were discussed with the patient.                            All questions were answered, and informed consent                            was obtained. Prior Anticoagulants: The patient has                            taken no previous anticoagulant or antiplatelet                            agents. ASA Grade Assessment: II - A patient with                            mild systemic disease. After reviewing the risks                            and benefits, the patient was deemed in                            satisfactory condition to undergo the procedure.                           After obtaining informed consent, the endoscope was  passed under direct vision. Throughout the                            procedure, the patient's blood pressure, pulse, and                            oxygen saturations were monitored continuously. The                            Endoscope was introduced through the mouth, and                            advanced to the second part of duodenum. The upper                            GI endoscopy was accomplished  without difficulty.                            The patient tolerated the procedure well. Scope In: Scope Out: Findings:                 The examined esophagus was normal.                           The gastroesophageal flap valve was visualized                            endoscopically and classified as Hill Grade III                            (minimal fold, loose to endoscope, hiatal hernia                            likely).                           Mildly friable mucosa with contact bleeding was                            found in the gastric fundus and in the gastric                            body. This was immediately visible after rinsing                            mucosa with water. Biopsies were taken with a cold                            forceps for histology and Helicobacter pylori                            testing (gastric fundus, body, antrum and incisura).                           The exam of the  stomach was otherwise normal.                           The examined duodenum was normal. Biopsies for                            histology were taken with a cold forceps for                            evaluation of celiac disease. Complications:            No immediate complications. Estimated Blood Loss:     Estimated blood loss was minimal. Impression:               - Normal esophagus.                           - Friable gastric mucosa. Biopsied.                           - Normal examined duodenum. Biopsied. Recommendation:           - Patient has a contact number available for                            emergencies. The signs and symptoms of potential                            delayed complications were discussed with the                            patient. Return to normal activities tomorrow.                            Written discharge instructions were provided to the                            patient.                           - Resume previous diet.                            - Continue present medications.                           - Await pathology results.                           - See the other procedure note for documentation of                            additional recommendations. Jerene Bears, MD 12/01/2018 2:21:58 PM This report has been signed electronically.

## 2018-12-03 ENCOUNTER — Telehealth: Payer: Self-pay | Admitting: *Deleted

## 2018-12-03 NOTE — Telephone Encounter (Signed)
  Follow up Call-  Call back number 12/01/2018  Post procedure Call Back phone  # [phone]  Permission to leave phone message Yes  Some recent data might be hidden     Patient questions:  Do you have a fever, pain , or abdominal swelling? No. Pain Score  0 *  Have you tolerated food without any problems? Yes.    Have you been able to return to your normal activities? Yes.    Do you have any questions about your discharge instructions: Diet   No. Medications  No. Follow up visit  No.  Do you have questions or concerns about your Care? No.  Actions: * If pain score is 4 or above: No action needed, pain <4.  1. Have you developed a fever since your procedure? NO  2.   Have you had an respiratory symptoms (SOB or cough) since your procedure? NO  3.   Have you tested positive for COVID 19 since your procedure NO  4.   Have you had any family members/close contacts diagnosed with the COVID 19 since your procedure?  NO   If yes to any of these questions please route to Joylene John, RN and Alphonsa Gin, RN.

## 2018-12-11 ENCOUNTER — Other Ambulatory Visit: Payer: Self-pay

## 2018-12-11 DIAGNOSIS — A048 Other specified bacterial intestinal infections: Secondary | ICD-10-CM

## 2018-12-11 MED ORDER — BIS SUBCIT-METRONID-TETRACYC 140-125-125 MG PO CAPS: 3.0000 | ORAL_CAPSULE | Freq: Three times a day (TID) | ORAL | 0 refills | Status: DC

## 2018-12-11 MED ORDER — PANTOPRAZOLE SODIUM 40 MG PO TBEC: 40.0000 mg | DELAYED_RELEASE_TABLET | Freq: Two times a day (BID) | ORAL | 0 refills | Status: DC

## 2019-01-31 LAB — BASIC METABOLIC PANEL
BUN/Creatinine Ratio: 16 (ref 9–23)
BUN: 10 mg/dL (ref 6–24)
CO2: 26 mmol/L (ref 20–29)
Calcium: 9.5 mg/dL (ref 8.7–10.2)
Chloride: 101 mmol/L (ref 96–106)
Creatinine, Ser: 0.64 mg/dL (ref 0.57–1.00)
GFR calc Af Amer: 115 mL/min/{1.73_m2} (ref >59)
GFR calc non Af Amer: 100 mL/min/{1.73_m2} (ref >59)
Glucose: 91 mg/dL (ref 65–99)
Potassium: 4.9 mmol/L (ref 3.5–5.2)
Sodium: 139 mmol/L (ref 134–144)

## 2019-02-02 ENCOUNTER — Telehealth: Payer: Self-pay | Admitting: Pharmacist Clinician (PhC)/ Clinical Pharmacy Specialist

## 2019-02-02 MED ORDER — BEMPEDOIC ACID-EZETIMIBE 180-10 MG PO TABS: 1.0000 | ORAL_TABLET | Freq: Every day | ORAL | 3 refills | Status: DC

## 2019-02-02 NOTE — Telephone Encounter (Signed)
LMOM for patient to return call.  Having mild facial swelling and upset stomach with rosuvastatin.  Will see if we can switch her to Nexletol or Nexlizet (she is already on ezetimibe - may be able to combine).

## 2019-02-02 NOTE — Telephone Encounter (Signed)
Spoke with patient.  She has had puffiness/swelling on days that she takes rosuvastatin, but does not feel that it is worrisome, just annoying.  Will have her stop both rosuvastatin and ezetimibe and will have her start Nexlizet 180/10 mg daily.  Patient aware to get copay card from internet, will call back if she has any problems.

## 2019-02-15 ENCOUNTER — Other Ambulatory Visit: Payer: Self-pay | Admitting: Internal Medicine

## 2019-02-22 ENCOUNTER — Other Ambulatory Visit: Payer: Self-pay | Admitting: Cardiology

## 2019-03-27 ENCOUNTER — Other Ambulatory Visit: Payer: BC Managed Care – PPO

## 2019-03-30 ENCOUNTER — Other Ambulatory Visit: Payer: BC Managed Care – PPO

## 2019-03-30 DIAGNOSIS — A048 Other specified bacterial intestinal infections: Secondary | ICD-10-CM

## 2019-03-31 LAB — HELICOBACTER PYLORI  SPECIAL ANTIGEN
MICRO NUMBER:: 10151947
SPECIMEN QUALITY: ADEQUATE

## 2019-04-01 ENCOUNTER — Other Ambulatory Visit: Payer: Self-pay

## 2019-04-01 DIAGNOSIS — D509 Iron deficiency anemia, unspecified: Secondary | ICD-10-CM

## 2019-04-13 ENCOUNTER — Other Ambulatory Visit (INDEPENDENT_AMBULATORY_CARE_PROVIDER_SITE_OTHER): Payer: BC Managed Care – PPO

## 2019-04-13 DIAGNOSIS — D509 Iron deficiency anemia, unspecified: Secondary | ICD-10-CM

## 2019-04-13 LAB — CBC WITH DIFFERENTIAL/PLATELET
Basophils Absolute: 0.1 10*3/uL (ref 0.0–0.1)
Basophils Relative: 0.9 % (ref 0.0–3.0)
Eosinophils Absolute: 0.1 10*3/uL (ref 0.0–0.7)
Eosinophils Relative: 1.5 % (ref 0.0–5.0)
HCT: 38.1 % (ref 36.0–46.0)
Hemoglobin: 12.8 g/dL (ref 12.0–15.0)
Lymphocytes Relative: 29.3 % (ref 12.0–46.0)
Lymphs Abs: 2 10*3/uL (ref 0.7–4.0)
MCHC: 33.6 g/dL (ref 30.0–36.0)
MCV: 92.1 fl (ref 78.0–100.0)
Monocytes Absolute: 0.6 10*3/uL (ref 0.1–1.0)
Monocytes Relative: 8.5 % (ref 3.0–12.0)
Neutro Abs: 4.1 10*3/uL (ref 1.4–7.7)
Neutrophils Relative %: 59.8 % (ref 43.0–77.0)
Platelets: 332 10*3/uL (ref 150.0–400.0)
RBC: 4.13 Mil/uL (ref 3.87–5.11)
RDW: 13 % (ref 11.5–15.5)
WBC: 6.9 10*3/uL (ref 4.0–10.5)

## 2019-04-13 LAB — IBC + FERRITIN
Ferritin: 49.2 ng/mL (ref 10.0–291.0)
Iron: 76 ug/dL (ref 42–145)
Saturation Ratios: 18.3 % — ABNORMAL LOW (ref 20.0–50.0)
Transferrin: 297 mg/dL (ref 212.0–360.0)

## 2019-04-16 ENCOUNTER — Other Ambulatory Visit: Payer: Self-pay

## 2019-04-16 ENCOUNTER — Other Ambulatory Visit: Payer: Self-pay | Admitting: Internal Medicine

## 2019-04-16 DIAGNOSIS — Z1231 Encounter for screening mammogram for malignant neoplasm of breast: Secondary | ICD-10-CM

## 2019-04-16 DIAGNOSIS — D509 Iron deficiency anemia, unspecified: Secondary | ICD-10-CM

## 2019-04-17 ENCOUNTER — Ambulatory Visit
Admission: RE | Admit: 2019-04-17 | Discharge: 2019-04-17 | Disposition: A | Discharge status: Home / Self Care | Payer: BC Managed Care – PPO | Source: Ambulatory Visit

## 2019-04-17 ENCOUNTER — Other Ambulatory Visit: Payer: Self-pay

## 2019-04-17 DIAGNOSIS — Z1231 Encounter for screening mammogram for malignant neoplasm of breast: Secondary | ICD-10-CM

## 2019-04-21 ENCOUNTER — Other Ambulatory Visit: Payer: Self-pay | Admitting: Internal Medicine

## 2019-04-21 DIAGNOSIS — R928 Other abnormal and inconclusive findings on diagnostic imaging of breast: Secondary | ICD-10-CM

## 2019-05-05 ENCOUNTER — Other Ambulatory Visit: Payer: Self-pay

## 2019-05-05 ENCOUNTER — Ambulatory Visit
Admission: RE | Admit: 2019-05-05 | Discharge: 2019-05-05 | Disposition: A | Discharge status: Home / Self Care | Payer: BC Managed Care – PPO | Source: Ambulatory Visit | Attending: Internal Medicine | Admitting: Internal Medicine

## 2019-05-05 DIAGNOSIS — R928 Other abnormal and inconclusive findings on diagnostic imaging of breast: Secondary | ICD-10-CM

## 2019-05-14 ENCOUNTER — Other Ambulatory Visit: Payer: Self-pay | Admitting: Internal Medicine

## 2019-07-18 ENCOUNTER — Other Ambulatory Visit: Payer: Self-pay | Admitting: Cardiology

## 2019-07-20 NOTE — Telephone Encounter (Signed)
Rx request sent to pharmacy.  

## 2019-08-14 ENCOUNTER — Other Ambulatory Visit: Payer: Self-pay | Admitting: Internal Medicine

## 2019-09-26 DIAGNOSIS — R931 Abnormal findings on diagnostic imaging of heart and coronary circulation: Secondary | ICD-10-CM | POA: Insufficient documentation

## 2019-09-26 DIAGNOSIS — Z7189 Other specified counseling: Secondary | ICD-10-CM | POA: Insufficient documentation

## 2019-09-26 NOTE — Progress Notes (Signed)
Cardiology Office Note   Date:  09/28/2019   ID:  Jacqueline, Beasley 1960/03/30, MRN [id number]  PCP:  Burnis Medin, MD  Cardiologist:   Minus Breeding, MD    Chief Complaint  Patient presents with  . ELEVATED CORONARY CALCIUM      History of Present Illness: Jacqueline Beasley is a 59 y.o. female who presents for evaluation of chest pain.  At the first appt in 2019 I performed a POET (Plain Old Exercise Treadmill).  She exercised for 11 minutes without any ischemic ST T wave changes.  She had a negative POET (Plain Old Exercise Treadmill) last month.   She had an excellent exercise tolerance.    Since I saw her she has done well.  She is walking and hiking.  They have a new [place]. The patient denies any new symptoms such as chest discomfort, neck or arm discomfort. There has been no new shortness of breath, PND or orthopnea. There have been no reported palpitations, presyncope or syncope.  Of note she did not tolerate statin.  She is now on bempedoic acid plus Zetia.  Past Medical History:  Diagnosis Date  . Allergy    seasonal  . 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 Back pain    Recurrent has seen neurosurgery in past  . History of anal fissures    pt has itching with this at times  . Hx of varicella   . Hypertension     Past Surgical History:  Procedure Laterality Date  . ABDOMINAL HYSTERECTOMY     total with bso  for fibroids.   . AUGMENTATION MAMMAPLASTY    . BREAST ENHANCEMENT SURGERY  2010  . CESAREAN SECTION  [id number]     Current Outpatient Medications  Medication Sig Dispense Refill  . Ascorbic Acid (VITAMIN C) 100 MG tablet Take 100 mg by mouth daily.    . Bempedoic Acid-Ezetimibe (NEXLIZET) 180-10 MG TABS Take by mouth.    . Cetirizine HCl (ZYRTEC ALLERGY) 10 MG CAPS Take 10 mg by mouth as needed. Reported on 03/23/2015    . cholecalciferol (VITAMIN D) 1000 UNITS tablet Take 2,000 Units by mouth daily.    . diphenhydrAMINE (BENADRYL) 25 MG tablet Take 25  mg by mouth every 6 (six) hours as needed.    Marland Kitchen estradiol (CLIMARA - DOSED IN MG/24 HR) 0.075 mg/24hr patch PLACE 1 PATCH (0.075 MG TOTAL) ONTO THE SKIN ONCE A WEEK. 12 patch 1  . Ferrous Gluconate-C-Folic Acid (IRON-C PO) Take 26 mg by mouth daily.    . fluticasone (FLONASE) 50 MCG/ACT nasal spray Place 2 sprays into both nostrils daily.    Marland Kitchen lisinopril (ZESTRIL) 20 MG tablet TAKE 1 TABLET BY MOUTH EVERY DAY 90 tablet 1  . pantoprazole (PROTONIX) 40 MG tablet TAKE 1 TABLET BY MOUTH EVERY DAY 90 tablet 0  . VITAMIN E PO Take by mouth.     No current facility-administered medications for this visit.    Allergies:   Ambien [zolpidem]    ROS:  Please see the history of present illness.   Otherwise, review of systems are positive for none.   All other systems are reviewed and negative.    PHYSICAL EXAM: VS:  BP 120/84 (BP Location: Left Arm, Patient Position: Sitting, Cuff Size: Normal)   Pulse 72   Ht 5\' 2"  (1.575 m)   Wt 160 lb 9.6 oz (72.8 kg)   BMI 29.37 kg/m  , BMI Body  mass index is 29.37 kg/m.  GENERAL:  Well appearing NECK:  No jugular venous distention, waveform within normal limits, carotid upstroke brisk and symmetric, no bruits, no thyromegaly LUNGS:  Clear to auscultation bilaterally CHEST:  Unremarkable HEART:  PMI not displaced or sustained,S1 and S2 within normal limits, no S3, no S4, no clicks, no rubs, no murmurs ABD:  Flat, positive bowel sounds normal in frequency in pitch, no bruits, no rebound, no guarding, no midline pulsatile mass, no hepatomegaly, no splenomegaly EXT:  2 plus pulses throughout, no edema, no cyanosis no clubbing   EKG:  EKG is  ordered today. Sinus rhythm, rate 72, axis within normal limits, intervals within normal limits, no acute ST-T wave changes.   Recent Labs: 01/30/2019: BUN 10; Creatinine, Ser 0.64; Potassium 4.9; Sodium 139 04/13/2019: Hemoglobin 12.8; Platelets 332.0    Lipid Panel    Component Value Date/Time   CHOL 199  11/17/2018 1130   TRIG 121 11/17/2018 1130   HDL 66 11/17/2018 1130   CHOLHDL 3.0 11/17/2018 1130   CHOLHDL 3 08/13/2018 1106   VLDL 20.4 08/13/2018 1106   LDLCALC 112 (H) 11/17/2018 1130   LDLDIRECT 149.9 03/03/2013 1106      Wt Readings from Last 3 Encounters:  09/28/19 160 lb 9.6 oz (72.8 kg)  12/01/18 171 lb (77.6 kg)  11/24/18 171 lb 12.8 oz (77.9 kg)      Other studies Reviewed: Additional studies/ records that were reviewed today include: Labs Review of the above records demonstrates:    See elsewhere   ASSESSMENT AND PLAN:  ELEVATED CORONARY CALCIUM:   We are going for aggressive risk reduction.  I will bring her back to do a treadmill in the spring when we are not testing for Covid.  She will continue with primary risk reduction as below.  She has no new symptoms.  DYSLIPIDEMIA:     She has not tolerated statin and was put on Nexlizet.  I will check a lipid profile today.  If she is not at target she would consider PCSK9 inhibitor.   HTN:  The blood pressure is at target.  No change in therapy.   COVID EDUCATION: She has been vaccinated.  Current medicines are reviewed at length with the patient today.  The patient does not have concerns regarding medicines.  The following changes have been made:   As above.  Labs/ tests ordered today include:     Orders Placed This Encounter  Procedures  . Lipid panel  . EXERCISE TOLERANCE TEST (ETT)  . EKG 12-Lead     Disposition:   FU with me in one year. Ronnell Guadalajara, MD  09/28/2019 10:25 AM    [place]

## 2019-09-28 ENCOUNTER — Encounter: Payer: Self-pay | Admitting: Cardiology

## 2019-09-28 ENCOUNTER — Other Ambulatory Visit: Payer: Self-pay

## 2019-09-28 ENCOUNTER — Ambulatory Visit: Payer: BC Managed Care – PPO | Admitting: Cardiology

## 2019-09-28 VITALS — BP 120/84 | HR 72 | Ht 62.0 in | Wt 160.6 lb

## 2019-09-28 DIAGNOSIS — E785 Hyperlipidemia, unspecified: Secondary | ICD-10-CM | POA: Diagnosis not present

## 2019-09-28 DIAGNOSIS — Z7189 Other specified counseling: Secondary | ICD-10-CM

## 2019-09-28 DIAGNOSIS — I10 Essential (primary) hypertension: Secondary | ICD-10-CM | POA: Diagnosis not present

## 2019-09-28 DIAGNOSIS — R931 Abnormal findings on diagnostic imaging of heart and coronary circulation: Secondary | ICD-10-CM

## 2019-09-28 NOTE — Patient Instructions (Signed)
Medication Instructions:  No changes *If you need a refill on your cardiac medications before your next appointment, please call your pharmacy*  Lab Work: Your physician recommends that you return for lab work today: Lipids If you have labs (blood work) drawn today and your tests are completely normal, you will receive your results only by: Marland Kitchen MyChart Message (if you have MyChart) OR . A paper copy in the mail If you have any lab test that is abnormal or we need to change your treatment, we will call you to review the results.  Testing/Procedures: Your physician has requested that you have an exercise tolerance test in April of 2022. For further information please visit [email or web address]. Please also follow instruction sheet, as given.  Follow-Up: At [facility], you and your health needs are our priority.  As part of our continuing mission to provide you with exceptional heart care, we have created designated Provider Care Teams.  These Care Teams include your primary Cardiologist (physician) and Advanced Practice Providers (APPs -  Physician Assistants and Nurse Practitioners) who all work together to provide you with the care you need, when you need it.  Your next appointment:   12 month(s)  The format for your next appointment:   In Person  Provider:   Minus Breeding, MD  Other Instructions Co-Pay card for Nexlizit given

## 2019-09-29 LAB — LIPID PANEL
Chol/HDL Ratio: 3.4 ratio (ref 0.0–4.4)
Cholesterol, Total: 233 mg/dL — ABNORMAL HIGH (ref 100–199)
HDL: 69 mg/dL (ref >39)
LDL Chol Calc (NIH): 150 mg/dL — ABNORMAL HIGH (ref 0–99)
Triglycerides: 80 mg/dL (ref 0–149)
VLDL Cholesterol Cal: 14 mg/dL (ref 5–40)

## 2019-10-02 ENCOUNTER — Telehealth: Payer: Self-pay | Admitting: Cardiology

## 2019-10-02 NOTE — Telephone Encounter (Signed)
Patient is returning [place] call regarding lab results. Transferred call to [facility].

## 2019-10-06 ENCOUNTER — Other Ambulatory Visit: Payer: Self-pay

## 2019-10-06 ENCOUNTER — Ambulatory Visit (INDEPENDENT_AMBULATORY_CARE_PROVIDER_SITE_OTHER): Payer: BC Managed Care – PPO | Admitting: Pharmacist

## 2019-10-06 DIAGNOSIS — E785 Hyperlipidemia, unspecified: Secondary | ICD-10-CM | POA: Diagnosis not present

## 2019-10-06 NOTE — Patient Instructions (Addendum)
Your Results:             Your most recent labs Goal  Total Cholesterol 233 < 200  Triglycerides 80 < 150  HDL (good cholesterol) 69 > 40  LDL (bad cholesterol 150 < 70     Medication changes: *Start prior authorization for Repatha 140mg /Praluent 150mg  every 14 days  Lab orders: *Repeat fasting blood work after 4th injection  Clinic phone number:  Tsion Inghram/Kristin/[place] [phone]   Thank you for choosing [facility]

## 2019-10-06 NOTE — Progress Notes (Signed)
Patient ID: Jacqueline Beasley                 DOB: 25-Jun-1960                    MRN: [id number]     HPI: Jacqueline Beasley is a 59 y.o. female patient referred to lipid clinic by Dr Percival Spanish. PMH is significant for hypertension, chest pain, hyperlipidemia, and elevated coronary calcium score with  93rd percentile for age and sex matched control. Noted intolerance to rosuvastatin and lack of therapeutic response with pravastatin. Patient is currently taking Nexlizet 180/10 daily, and follows a modified Keto diet. Reports recent weigh loss and increased physical activity.  Current Medications:  nexlizet 180-10mg  daily  Intolerances:  Rosuvastatin 10mg  3x/week - facial swelling Pravastatin 20mg  3x/week - lack therapeutic response   LDL goal: < 70mg /dL  Diet: mostly home cooked foods, prefers fresh foods; not much red meat, more chicken and fish; snacks are nuts and cheeses  Exercise: Walks (brisk pace), yoga  Family History: Mother massive stroke at 61 (hypertension); father died [place] dementia, 2 siblings with hypertension difficult to control, and son with hypertension now 26.   Social History: denies tobacco or alcohol use  Labs: 10/08/2019: CHO 233, TG 80, HDL 69, LDL-c 150 (on Nexlizet)  Past Medical History:  Diagnosis Date  . Allergy    seasonal  . 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 Back pain    Recurrent has seen neurosurgery in past  . History of anal fissures    pt has itching with this at times  . Hx of varicella   . Hypertension     Current Outpatient Medications on File Prior to Visit  Medication Sig Dispense Refill  . Ascorbic Acid (VITAMIN C) 100 MG tablet Take 100 mg by mouth daily.    . Bempedoic Acid-Ezetimibe (NEXLIZET) 180-10 MG TABS Take by mouth.    . Cetirizine HCl (ZYRTEC ALLERGY) 10 MG CAPS Take 10 mg by mouth as needed. Reported on 03/23/2015    . cholecalciferol (VITAMIN D) 1000 UNITS tablet Take 2,000 Units by mouth daily.    . diphenhydrAMINE (BENADRYL)  25 MG tablet Take 25 mg by mouth every 6 (six) hours as needed.    Marland Kitchen estradiol (CLIMARA - DOSED IN MG/24 HR) 0.075 mg/24hr patch PLACE 1 PATCH (0.075 MG TOTAL) ONTO THE SKIN ONCE A WEEK. 12 patch 1  . Ferrous Gluconate-C-Folic Acid (IRON-C PO) Take 26 mg by mouth daily.    . fluticasone (FLONASE) 50 MCG/ACT nasal spray Place 2 sprays into both nostrils daily.    Marland Kitchen lisinopril (ZESTRIL) 20 MG tablet TAKE 1 TABLET BY MOUTH EVERY DAY 90 tablet 1  . pantoprazole (PROTONIX) 40 MG tablet TAKE 1 TABLET BY MOUTH EVERY DAY 90 tablet 0  . VITAMIN E PO Take by mouth.     No current facility-administered medications on file prior to visit.    Allergies  Allergen Reactions  . Ambien [Zolpidem] Other (See Comments)    Amnesia     Hyperlipidemia LDL remains above goal for secondary prevention. Noted total cholesterol and LDL increased from baseline while taking Nexlizet and following Keto Diet. Patient will decrease the amount of cheese and animal based proteins in her diet.  We discussed the MOA, storage , administration, monitoring, common side effects and co-pay assistance available for PCSK9i therapy. Patient agreed to start injectable as soon as approved by her insurance. She is to continue  taking Nexlizet until Rephata/ Praluent available.   Plan to follow up as needed and repeat fasting blood work after 4 doses of new medication.    Armando Bukhari Rodriguez-Guzman PharmD, BCPS, CPP [place] [id number] 10/08/2019 10:31 AM

## 2019-10-08 NOTE — Assessment & Plan Note (Signed)
LDL remains above goal for secondary prevention. Noted total cholesterol and LDL increased from baseline while taking Nexlizet and following Keto Diet. Patient will decrease the amount of cheese and animal based proteins in her diet.  We discussed the MOA, storage , administration, monitoring, common side effects and co-pay assistance available for PCSK9i therapy. Patient agreed to start injectable as soon as approved by her insurance. She is to continue taking Nexlizet until Rephata/ Praluent available.   Plan to follow up as needed and repeat fasting blood work after 4 doses of new medication.

## 2019-10-12 ENCOUNTER — Telehealth: Payer: Self-pay

## 2019-10-12 MED ORDER — REPATHA SURECLICK 140 MG/ML ~~LOC~~ SOAJ: 140.0000 mg | SUBCUTANEOUS | 11 refills | Status: DC

## 2019-10-12 NOTE — Telephone Encounter (Signed)
lmomed the pt to start the repatha and to enroll herself into the copay card program

## 2019-10-20 ENCOUNTER — Telehealth: Payer: Self-pay

## 2019-10-20 NOTE — Telephone Encounter (Signed)
Spoke with patient to remind her that she is due for repeat labs. Advised patient that she can go by at her convenience between 7:30-5, Monday-Friday. Pt aware that she does not need to fast, provided address to the office, aware that she will go to the basement of our office building.

## 2019-10-20 NOTE — Telephone Encounter (Signed)
-----   Message from Algernon Huxley, RN sent at 04/16/2019  9:17 AM EST ----- Regarding: Labs Pt needs labs, orders in epic.

## 2019-11-10 ENCOUNTER — Other Ambulatory Visit: Payer: Self-pay | Admitting: Internal Medicine

## 2019-12-04 ENCOUNTER — Other Ambulatory Visit: Payer: Self-pay | Admitting: Internal Medicine

## 2020-01-01 ENCOUNTER — Other Ambulatory Visit: Payer: Self-pay | Admitting: Internal Medicine

## 2020-01-26 ENCOUNTER — Other Ambulatory Visit: Payer: Self-pay | Admitting: Internal Medicine

## 2020-01-28 ENCOUNTER — Telehealth: Payer: Self-pay | Admitting: Pharmacist

## 2020-01-28 DIAGNOSIS — E785 Hyperlipidemia, unspecified: Secondary | ICD-10-CM

## 2020-01-28 DIAGNOSIS — Z5181 Encounter for therapeutic drug level monitoring: Secondary | ICD-10-CM

## 2020-01-28 NOTE — Telephone Encounter (Signed)
Lipid panel and LFTs on repatha

## 2020-02-03 LAB — LIPID PANEL WITH LDL/HDL RATIO
Cholesterol, Total: 139 mg/dL (ref 100–199)
HDL: 66 mg/dL (ref >39)
LDL Chol Calc (NIH): 51 mg/dL (ref 0–99)
LDL/HDL Ratio: 0.8 ratio (ref 0.0–3.2)
Triglycerides: 124 mg/dL (ref 0–149)
VLDL Cholesterol Cal: 22 mg/dL (ref 5–40)

## 2020-02-03 LAB — HEPATIC FUNCTION PANEL
ALT: 25 IU/L (ref 0–32)
AST: 17 IU/L (ref 0–40)
Albumin: 4 g/dL (ref 3.8–4.9)
Alkaline Phosphatase: 77 IU/L (ref 44–121)
Bilirubin Total: 0.3 mg/dL (ref 0.0–1.2)
Bilirubin, Direct: <0.1 mg/dL (ref 0.00–0.40)
Total Protein: 6.6 g/dL (ref 6.0–8.5)

## 2020-02-08 ENCOUNTER — Other Ambulatory Visit: Payer: Self-pay | Admitting: Cardiology

## 2020-02-24 ENCOUNTER — Other Ambulatory Visit: Payer: Self-pay | Admitting: Internal Medicine

## 2020-02-24 NOTE — Telephone Encounter (Signed)
Last office visit- 08/13/2018 Last refill--01/26/2020---30 tabs with no refills   Pharmacy updated

## 2020-03-01 NOTE — Telephone Encounter (Signed)
Last visit with me was 7 2020  Needs ov  Virtual  Ok for now  for med check for refills.  Alternately her Gi team can take over the medication.

## 2020-03-11 ENCOUNTER — Other Ambulatory Visit: Payer: Self-pay | Admitting: Internal Medicine

## 2020-04-11 NOTE — Progress Notes (Signed)
Chief Complaint  Patient presents with  . Annual Exam    CPE/labs. No concerns.   Declines flu shot today.     HPI: Patient  Jacqueline Beasley  60 y.o. comes in today for Preventive Health Care visit   HLD repatha :  At goal dec 2021 ldl 50 range  Dr Lolly Mustache  Not on other meds for lipids Lisinopril: nose  For bp Gi pantoprazole  Every 2 weeks mild cramps and diarrhea. Back  arhtritis and hands  Go numb  Seen dr Hal Neer in past.  Take aleve and does better no progression or weakness  To get flu vaccine via work   Travel to [place] end of April  Health Maintenance  Topic Date Due  . INFLUENZA VACCINE  Never done  . MAMMOGRAM  05/04/2021  . TETANUS/TDAP  03/22/2025  . COLONOSCOPY (Pts 45-56yrs Insurance coverage will need to be confirmed)  11/30/2028  . COVID-19 Vaccine  Completed  . Hepatitis C Screening  Completed  . HIV Screening  Completed  . HPV VACCINES  Aged Out   Health Maintenance Review LIFESTYLE:  Exercise:   Walk  And hike .  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 GI /GU: No adominal pain, vomiting, change in bowel habits. No blood in the stool. No significant GU symptoms. SKIN/HEME: ,no acute skin rashes suspicious lesions or bleeding. No lymphadenopathy, nodules, masses.  NEURO/ PSYCH:  No neurologic signs such as weakness numbness. No depression anxiety. IMM/ Allergy: No unusual infections.  Allergy .   REST of 12 system review negative except as per HPI   Past Medical History:  Diagnosis Date  . Allergy    seasonal  . 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 Back pain    Recurrent has seen neurosurgery in past  . History of anal fissures    pt has itching with  this at times  . Hx of varicella   . Hypertension     Past Surgical History:  Procedure Laterality Date  . ABDOMINAL HYSTERECTOMY     total with bso  for fibroids.   . AUGMENTATION MAMMAPLASTY    . BREAST ENHANCEMENT SURGERY  2010  . CESAREAN SECTION  [id number]    Family History  Problem Relation Age of Onset  . Cancer Mother        Uterus  . Stroke Mother        died 59   . Hypertension Mother   . Diabetes Maternal Grandmother   . Hypertension Sister   . Colon cancer Neg Hx     Social History   Socioeconomic History  . Marital status: Married    Spouse name: Not on file  . Number of children: 2  . Years of education: Not on file  . Highest education level: Not on file  Occupational History  . Not on file  Tobacco Use  . Smoking status: Never Smoker  . Smokeless tobacco: Never Used  Vaping Use  . Vaping Use: Never used  Substance and Sexual Activity  . Alcohol use: Yes    Alcohol/week: 7.0 standard drinks    Types: 7 Glasses of wine  per week    Comment: 1 glass of wine a night  . Drug use: No  . Sexual activity: Yes  Other Topics Concern  . Not on file  Social History Narrative   Lives at home with husband.   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 Ascorbic Acid (VITAMIN C) 100 MG tablet Take 100 mg by mouth daily.    . Cetirizine HCl 10 MG CAPS Take 10 mg by mouth as needed. Reported on 03/23/2015    . cholecalciferol (VITAMIN D) 1000 UNITS tablet Take 2,000 Units by mouth daily.    . diphenhydrAMINE (BENADRYL) 25 MG tablet Take 25 mg by mouth every 6 (six) hours as  needed.    Marland Kitchen estradiol (CLIMARA - DOSED IN MG/24 HR) 0.075 mg/24hr patch PLACE 1 PATCH (0.075 MG TOTAL) ONTO THE SKIN ONCE A WEEK. 12 patch 1  . Evolocumab (REPATHA SURECLICK) 403 MG/ML SOAJ Inject 140 mg into the skin every 14 (fourteen) days. 2.1 mL 11  . Ferrous Gluconate-C-Folic Acid (IRON-C PO) Take 26 mg by mouth daily.    . fluticasone (FLONASE) 50 MCG/ACT nasal spray Place 2 sprays into both nostrils daily.    Marland Kitchen lisinopril (ZESTRIL) 20 MG tablet TAKE 1 TABLET BY MOUTH EVERY DAY 90 tablet 1  . pantoprazole (PROTONIX) 40 MG tablet Take 1 tablet (40 mg total) by mouth daily. Needs appt  For further refill [id number] 15 tablet 0  . VITAMIN E PO Take by mouth.    . Bempedoic Acid-Ezetimibe (NEXLIZET) 180-10 MG TABS Take by mouth.     No facility-administered medications prior to visit.     EXAM:  BP 124/82   Pulse 79   Temp 97.9 F (36.6 C) (Oral)   Ht 5' 2.5" (1.588 m)   Wt 172 lb (78 kg)   SpO2 98%   BMI 30.96 kg/m   Body mass index is 30.96 kg/m. Wt Readings from Last 3 Encounters:  04/12/20 172 lb (78 kg)  09/28/19 160 lb 9.6 oz (72.8 kg)  12/01/18 171 lb (77.6 kg)    Physical Exam: Vital signs reviewed VFI:EPPI is a well-developed well-nourished alert cooperative    who appearsr stated age in no acute distress.  HEENT: normocephalic atraumatic , Eyes: PERRL EOM's full, conjunctiva clear, Nares: paten,t no deformity discharge or tenderness., Ears: no deformity EAC's clear TMs with normal landmarks. Mouth:masked NECK: supple without masses, thyromegaly or bruits. CHEST/PULM:  Clear to auscultation and percussion breath sounds equal no wheeze , rales or rhonchi. No chest wall deformities or tenderness. Breast: normal by inspection . No dimpling, discharge, masses, tenderness or discharge  Implants . CV: PMI is nondisplaced, S1 S2 no gallops, murmurs, rubs. Peripheral pulses are full without delay.No JVD .  ABDOMEN: Bowel sounds normal nontender  No guard or rebound, no  hepato splenomegal no CVA tenderness.  No hernia. Extremtities:  No clubbing cyanosis or edema, no acute joint swelling or redness no focal atrophy NEURO:  Oriented x3, cranial nerves 3-12 appear to be intact, no obvious focal weakness,gait within normal  limits no abnormal reflexes or asymmetrical SKIN: No acute rashes normal turgor, color, no bruising or petechiae. PSYCH: Oriented, good eye contact, no obvious depression anxiety, cognition and judgment appear normal. LN: no cervical axillary inguinal adenopathy  Lab Results  Component Value Date   WBC 6.9 04/13/2019   HGB 12.8 04/13/2019   HCT 38.1 04/13/2019   PLT 332.0 04/13/2019   GLUCOSE 91 01/30/2019   CHOL 139 02/02/2020   TRIG 124 02/02/2020   HDL 66 02/02/2020   LDLDIRECT 149.9 03/03/2013   LDLCALC 51 02/02/2020   ALT 25 02/02/2020   AST 17 02/02/2020   NA 139 01/30/2019   K 4.9 01/30/2019   CL 101 01/30/2019   CREATININE 0.64 01/30/2019   BUN 10 01/30/2019   CO2 26 01/30/2019   TSH 2.01 08/13/2018   HGBA1C 6.0 08/13/2018    BP Readings from Last 3 Encounters:  04/12/20 124/82  09/28/19 120/84  12/01/18 122/77    Lab planeviewed with patient   ASSESSMENT AND PLAN:  Discussed the following assessment and plan:    ICD-10-CM   1. Visit for preventive health examination  H70.26 Basic metabolic panel    CBC with Differential/Platelet    Hepatic function panel    TSH    Hemoglobin A1c    Hemoglobin A1c    TSH    Hepatic function panel    CBC with Differential/Platelet    Basic metabolic panel  2. Medication management  V78.588 Basic metabolic panel    CBC with Differential/Platelet    Hepatic function panel    TSH    Hemoglobin A1c    Hemoglobin A1c    TSH    Hepatic function panel    CBC with Differential/Platelet    Basic metabolic panel  3. Essential hypertension  F02 Basic metabolic panel    CBC with Differential/Platelet    Hepatic function panel    TSH    Hemoglobin A1c    Hemoglobin A1c     TSH    Hepatic function panel    CBC with Differential/Platelet    Basic metabolic panel  4. Fasting hyperglycemia  D74.12 Basic metabolic panel    CBC with Differential/Platelet    Hepatic function panel    TSH    Hemoglobin A1c    Hemoglobin A1c    TSH    Hepatic function panel    CBC with Differential/Platelet    Basic metabolic panel  5. Hyperlipidemia, unspecified hyperlipidemia type  I78.6 Basic metabolic panel    CBC with Differential/Platelet    Hepatic function panel    TSH    Hemoglobin A1c    Hemoglobin A1c    TSH    Hepatic function panel    CBC with Differential/Platelet    Basic metabolic panel  6. Family history of diabetes mellitus  V67.2 Basic metabolic panel    CBC with Differential/Platelet    Hepatic function panel    TSH    Hemoglobin A1c    Hemoglobin A1c    TSH    Hepatic function panel    CBC with Differential/Platelet    Basic metabolic panel    Return in about 1 year (around 04/12/2021) for preventive /cpx and medications.  Patient Care Team: Ferdinando Lodge, Standley Brooking, MD as PCP - General (Internal Medicine) Karie Chimera, MD as Consulting Physician (Neurosurgery) Minus Breeding, MD as Consulting Physician (Cardiology) Pyrtle, Lajuan Lines, MD as Consulting Physician (Gastroenterology) Patient Instructions   Glad you are doing well.  Continue lifestyle  intervention healthy eating and exercise .  Will notify you  of labs when available.   Have a good trip   Health Maintenance, Female Adopting a healthy lifestyle and getting preventive care are important in promoting health and wellness. Ask your health care provider about:  The right schedule for you to have regular tests and exams.  Things you can do on your own to prevent diseases and keep yourself healthy. What should I know about diet, weight, and exercise? Eat a healthy diet  Eat a diet that includes plenty of vegetables, fruits, low-fat dairy products, and lean protein.  Do not eat a  lot of foods that are high in solid fats, added sugars, or sodium.   Maintain a healthy weight Body mass index (BMI) is used to identify weight problems. It estimates body fat based on height and weight. Your health care provider can help determine your BMI and help you achieve or maintain a healthy weight. Get regular exercise Get regular exercise. This is one of the most important things you can do for your health. Most adults should:  Exercise for at least 150 minutes each week. The exercise should increase your heart rate and make you sweat (moderate-intensity exercise).  Do strengthening exercises at least twice a week. This is in addition to the moderate-intensity exercise.  Spend less time sitting. Even light physical activity can be beneficial. Watch cholesterol and blood lipids Have your blood tested for lipids and cholesterol at 58 years of age, then have this test every 5 years. Have your cholesterol levels checked more often if:  Your lipid or cholesterol levels are high.  You are older than 58 years of age.  You are at high risk for heart disease. What should I know about cancer screening? Depending on your health history and family history, you may need to have cancer screening at various ages. This may include screening for:  Breast cancer.  Cervical cancer.  Colorectal cancer.  Skin cancer.  Lung cancer. What should I know about heart disease, diabetes, and high blood pressure? Blood pressure and heart disease  High blood pressure causes heart disease and increases the risk of stroke. This is more likely to develop in people who have high blood pressure readings, are of African descent, or are overweight.  Have your blood pressure checked: ? Every 3-5 years if you are 58-41 years of age. ? Every year if you are 66 years old or older. Diabetes Have regular diabetes screenings. This checks your fasting blood sugar level. Have the screening done:  Once every  three years after age 34 if you are at a normal weight and have a low risk for diabetes.  More often and at a younger age if you are overweight or have a high risk for diabetes. What should I know about preventing infection? Hepatitis B If you have a higher risk for hepatitis B, you should be screened for this virus. Talk with your health care provider to find out if you are at risk for hepatitis B infection. Hepatitis C Testing is recommended for:  Everyone born from [birth year] through 1963.  Anyone with known risk factors for hepatitis C. Sexually transmitted infections (STIs)  Get screened for STIs, including gonorrhea and chlamydia, if: ? You are sexually active and are younger than 58 years of age. ? You are older than 58 years of age and your health care provider tells you that you are at risk for this type of infection. ? Your  sexual activity has changed since you were last screened, and you are at increased risk for chlamydia or gonorrhea. Ask your health care provider if you are at risk.  Ask your health care provider about whether you are at high risk for HIV. Your health care provider may recommend a prescription medicine to help prevent HIV infection. If you choose to take medicine to prevent HIV, you should first get tested for HIV. You should then be tested every 3 months for as long as you are taking the medicine. Pregnancy  If you are about to stop having your period (premenopausal) and you may become pregnant, seek counseling before you get pregnant.  Take 400 to 800 micrograms (mcg) of folic acid every day if you become pregnant.  Ask for birth control (contraception) if you want to prevent pregnancy. Osteoporosis and menopause Osteoporosis is a disease in which the bones lose minerals and strength with aging. This can result in bone fractures. If you are 58 years old or older, or if you are at risk for osteoporosis and fractures, ask your health care provider if you  should:  Be screened for bone loss.  Take a calcium or vitamin D supplement to lower your risk of fractures.  Be given hormone replacement therapy (HRT) to treat symptoms of menopause. Follow these instructions at home: Lifestyle  Do not use any products that contain nicotine or tobacco, such as cigarettes, e-cigarettes, and chewing tobacco. If you need help quitting, ask your health care provider.  Do not use street drugs.  Do not share needles.  Ask your health care provider for help if you need support or information about quitting drugs. Alcohol use  Do not drink alcohol if: ? Your health care provider tells you not to drink. ? You are pregnant, may be pregnant, or are planning to become pregnant.  If you drink alcohol: ? Limit how much you use to 0-1 drink a day. ? Limit intake if you are breastfeeding.  Be aware of how much alcohol is in your drink. In the [place] one drink equals one 12 oz bottle of beer (355 mL), one 5 oz glass of wine (148 mL), or one 1 oz glass of hard liquor (44 mL). General instructions  Schedule regular health, dental, and eye exams.  Stay current with your vaccines.  Tell your health care provider if: ? You often feel depressed. ? You have ever been abused or do not feel safe at home. Summary  Adopting a healthy lifestyle and getting preventive care are important in promoting health and wellness.  Follow your health care provider's instructions about healthy diet, exercising, and getting tested or screened for diseases.  Follow your health care provider's instructions on monitoring your cholesterol and blood pressure. This information is not intended to replace advice given to you by your health care provider. Make sure you discuss any questions you have with your health care provider. Document Revised: 01/22/2018 Document Reviewed: 01/22/2018 Elsevier Patient Education  2021 [employer]. Tyjai Charbonnet M.D.

## 2020-04-12 ENCOUNTER — Other Ambulatory Visit: Payer: Self-pay

## 2020-04-12 ENCOUNTER — Encounter: Payer: Self-pay | Admitting: Internal Medicine

## 2020-04-12 ENCOUNTER — Ambulatory Visit: Payer: BC Managed Care – PPO | Admitting: Internal Medicine

## 2020-04-12 VITALS — BP 124/82 | HR 79 | Temp 97.9°F | Ht 62.5 in | Wt 172.0 lb

## 2020-04-12 DIAGNOSIS — Z79899 Other long term (current) drug therapy: Secondary | ICD-10-CM | POA: Diagnosis not present

## 2020-04-12 DIAGNOSIS — E785 Hyperlipidemia, unspecified: Secondary | ICD-10-CM | POA: Diagnosis not present

## 2020-04-12 DIAGNOSIS — R7301 Impaired fasting glucose: Secondary | ICD-10-CM | POA: Diagnosis not present

## 2020-04-12 DIAGNOSIS — I10 Essential (primary) hypertension: Secondary | ICD-10-CM

## 2020-04-12 DIAGNOSIS — Z833 Family history of diabetes mellitus: Secondary | ICD-10-CM

## 2020-04-12 DIAGNOSIS — Z0000 Encounter for general adult medical examination without abnormal findings: Secondary | ICD-10-CM | POA: Diagnosis not present

## 2020-04-12 LAB — CBC WITH DIFFERENTIAL/PLATELET
Basophils Absolute: 0.1 10*3/uL (ref 0.0–0.1)
Basophils Relative: 1 % (ref 0.0–3.0)
Eosinophils Absolute: 0.2 10*3/uL (ref 0.0–0.7)
Eosinophils Relative: 2.6 % (ref 0.0–5.0)
HCT: 38.4 % (ref 36.0–46.0)
Hemoglobin: 12.8 g/dL (ref 12.0–15.0)
Lymphocytes Relative: 25.4 % (ref 12.0–46.0)
Lymphs Abs: 1.6 10*3/uL (ref 0.7–4.0)
MCHC: 33.3 g/dL (ref 30.0–36.0)
MCV: 89.7 fl (ref 78.0–100.0)
Monocytes Absolute: 0.5 10*3/uL (ref 0.1–1.0)
Monocytes Relative: 8.4 % (ref 3.0–12.0)
Neutro Abs: 3.9 10*3/uL (ref 1.4–7.7)
Neutrophils Relative %: 62.6 % (ref 43.0–77.0)
Platelets: 325 10*3/uL (ref 150.0–400.0)
RBC: 4.28 Mil/uL (ref 3.87–5.11)
RDW: 13.5 % (ref 11.5–15.5)
WBC: 6.3 10*3/uL (ref 4.0–10.5)

## 2020-04-12 LAB — BASIC METABOLIC PANEL
BUN: 11 mg/dL (ref 6–23)
CO2: 29 mEq/L (ref 19–32)
Calcium: 9.2 mg/dL (ref 8.4–10.5)
Chloride: 102 mEq/L (ref 96–112)
Creatinine, Ser: 0.58 mg/dL (ref 0.40–1.20)
GFR: 100.14 mL/min (ref >60.00)
Glucose, Bld: 100 mg/dL — ABNORMAL HIGH (ref 70–99)
Potassium: 4.5 mEq/L (ref 3.5–5.1)
Sodium: 139 mEq/L (ref 135–145)

## 2020-04-12 LAB — HEMOGLOBIN A1C: Hgb A1c MFr Bld: 5.6 % (ref 4.6–6.5)

## 2020-04-12 LAB — TSH: TSH: 1.78 u[IU]/mL (ref 0.35–4.50)

## 2020-04-12 LAB — HEPATIC FUNCTION PANEL
ALT: 18 U/L (ref 0–35)
AST: 17 U/L (ref 0–37)
Albumin: 4.2 g/dL (ref 3.5–5.2)
Alkaline Phosphatase: 62 U/L (ref 39–117)
Bilirubin, Direct: 0.1 mg/dL (ref 0.0–0.3)
Total Bilirubin: 0.4 mg/dL (ref 0.2–1.2)
Total Protein: 6.5 g/dL (ref 6.0–8.3)

## 2020-04-12 NOTE — Patient Instructions (Signed)
Glad you are doing well.  Continue lifestyle intervention healthy eating and exercise .  Will notify you  of labs when available.   Have a good trip   Health Maintenance, Female Adopting a healthy lifestyle and getting preventive care are important in promoting health and wellness. Ask your health care provider about:  The right schedule for you to have regular tests and exams.  Things you can do on your own to prevent diseases and keep yourself healthy. What should I know about diet, weight, and exercise? Eat a healthy diet  Eat a diet that includes plenty of vegetables, fruits, low-fat dairy products, and lean protein.  Do not eat a lot of foods that are high in solid fats, added sugars, or sodium.   Maintain a healthy weight Body mass index (BMI) is used to identify weight problems. It estimates body fat based on height and weight. Your health care provider can help determine your BMI and help you achieve or maintain a healthy weight. Get regular exercise Get regular exercise. This is one of the most important things you can do for your health. Most adults should:  Exercise for at least 150 minutes each week. The exercise should increase your heart rate and make you sweat (moderate-intensity exercise).  Do strengthening exercises at least twice a week. This is in addition to the moderate-intensity exercise.  Spend less time sitting. Even light physical activity can be beneficial. Watch cholesterol and blood lipids Have your blood tested for lipids and cholesterol at 58 years of age, then have this test every 5 years. Have your cholesterol levels checked more often if:  Your lipid or cholesterol levels are high.  You are older than 59 years of age.  You are at high risk for heart disease. What should I know about cancer screening? Depending on your health history and family history, you may need to have cancer screening at various ages. This may include screening  for:  Breast cancer.  Cervical cancer.  Colorectal cancer.  Skin cancer.  Lung cancer. What should I know about heart disease, diabetes, and high blood pressure? Blood pressure and heart disease  High blood pressure causes heart disease and increases the risk of stroke. This is more likely to develop in people who have high blood pressure readings, are of African descent, or are overweight.  Have your blood pressure checked: ? Every 3-5 years if you are 65-32 years of age. ? Every year if you are 55 years old or older. Diabetes Have regular diabetes screenings. This checks your fasting blood sugar level. Have the screening done:  Once every three years after age 9 if you are at a normal weight and have a low risk for diabetes.  More often and at a younger age if you are overweight or have a high risk for diabetes. What should I know about preventing infection? Hepatitis B If you have a higher risk for hepatitis B, you should be screened for this virus. Talk with your health care provider to find out if you are at risk for hepatitis B infection. Hepatitis C Testing is recommended for:  Everyone born from [birth year] through 1963.  Anyone with known risk factors for hepatitis C. Sexually transmitted infections (STIs)  Get screened for STIs, including gonorrhea and chlamydia, if: ? You are sexually active and are younger than 58 years of age. ? You are older than 58 years of age and your health care provider tells you that you are at risk  for this type of infection. ? Your sexual activity has changed since you were last screened, and you are at increased risk for chlamydia or gonorrhea. Ask your health care provider if you are at risk.  Ask your health care provider about whether you are at high risk for HIV. Your health care provider may recommend a prescription medicine to help prevent HIV infection. If you choose to take medicine to prevent HIV, you should first get tested for HIV.  You should then be tested every 3 months for as long as you are taking the medicine. Pregnancy  If you are about to stop having your period (premenopausal) and you may become pregnant, seek counseling before you get pregnant.  Take 400 to 800 micrograms (mcg) of folic acid every day if you become pregnant.  Ask for birth control (contraception) if you want to prevent pregnancy. Osteoporosis and menopause Osteoporosis is a disease in which the bones lose minerals and strength with aging. This can result in bone fractures. If you are 63 years old or older, or if you are at risk for osteoporosis and fractures, ask your health care provider if you should:  Be screened for bone loss.  Take a calcium or vitamin D supplement to lower your risk of fractures.  Be given hormone replacement therapy (HRT) to treat symptoms of menopause. Follow these instructions at home: Lifestyle  Do not use any products that contain nicotine or tobacco, such as cigarettes, e-cigarettes, and chewing tobacco. If you need help quitting, ask your health care provider.  Do not use street drugs.  Do not share needles.  Ask your health care provider for help if you need support or information about quitting drugs. Alcohol use  Do not drink alcohol if: ? Your health care provider tells you not to drink. ? You are pregnant, may be pregnant, or are planning to become pregnant.  If you drink alcohol: ? Limit how much you use to 0-1 drink a day. ? Limit intake if you are breastfeeding.  Be aware of how much alcohol is in your drink. In the [place] one drink equals one 12 oz bottle of beer (355 mL), one 5 oz glass of wine (148 mL), or one 1 oz glass of hard liquor (44 mL). General instructions  Schedule regular health, dental, and eye exams.  Stay current with your vaccines.  Tell your health care provider if: ? You often feel depressed. ? You have ever been abused or do not feel safe at  home. Summary  Adopting a healthy lifestyle and getting preventive care are important in promoting health and wellness.  Follow your health care provider's instructions about healthy diet, exercising, and getting tested or screened for diseases.  Follow your health care provider's instructions on monitoring your cholesterol and blood pressure. This information is not intended to replace advice given to you by your health care provider. Make sure you discuss any questions you have with your health care provider. Document Revised: 01/22/2018 Document Reviewed: 01/22/2018 Elsevier Patient Education  2021 [employer].

## 2020-04-12 NOTE — Progress Notes (Signed)
Blood sugar  a1c is better    rest stab;e

## 2020-05-16 NOTE — Telephone Encounter (Signed)
I was not here on April 1 so just got this message today Please put her on my schedule virtual okay.

## 2020-05-17 NOTE — Telephone Encounter (Signed)
Spoke with the patient and an virtual visit was scheduled for 04/06.

## 2020-05-18 ENCOUNTER — Encounter: Payer: Self-pay | Admitting: Internal Medicine

## 2020-05-18 ENCOUNTER — Telehealth: Payer: BC Managed Care – PPO | Admitting: Internal Medicine

## 2020-05-18 VITALS — BP 128/80 | Temp 97.0°F | Wt 165.0 lb

## 2020-05-18 DIAGNOSIS — J019 Acute sinusitis, unspecified: Secondary | ICD-10-CM | POA: Diagnosis not present

## 2020-05-18 MED ORDER — AMOXICILLIN-POT CLAVULANATE 875-125 MG PO TABS: 1.0000 | ORAL_TABLET | Freq: Two times a day (BID) | ORAL | 0 refills | Status: DC

## 2020-05-18 NOTE — Progress Notes (Signed)
Virtual Visit via Video Note  I connected with@ on 05/18/20 at 12:30 PM EDT by a video enabled telemedicine application and verified that I am speaking with the correct person using two identifiers. Location patient: home office Location provider:work  office Persons participating in the virtual visit: patient, provider  [place] national recommendations  regarding COVID 19 pandemic   video visit is advised over in office visit for this patient.  Patient aware  of the limitations of evaluation and management by telemedicine and  availability of in person appointments. and agreed to proceed.   HPI: Jacqueline Beasley presents for video visit for what she states is a typical sinus infection. She had some pressure and congestion in early March felt it was allergy treated same uses sinus hygiene but things progressed over the last 10 days with bilateral maxillary pain tenderness in frontal area with right ear discomfort.  Yellow discharge from the nose occasional blood no significant cough is using Sudafed saline and her Flonase. States this is typical when she gets a sinus infection last 1 was probably 2 years ago. [place] end of the [place].  ROS: See pertinent positives and negatives per HPI.  No fever chills  Past Medical History:  Diagnosis Date  . Allergy    seasonal  . 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 Back pain    Recurrent has seen neurosurgery in past  . History of anal fissures    pt has itching with this at times  . Hx of varicella   . Hypertension     Past Surgical History:  Procedure Laterality Date  . ABDOMINAL HYSTERECTOMY     total with bso  for fibroids.   . AUGMENTATION MAMMAPLASTY    . BREAST ENHANCEMENT SURGERY  2010  . CESAREAN SECTION  [id number]    Family History  Problem Relation Age of Onset  . Cancer Mother        Uterus  . Stroke Mother        died 2   . Hypertension Mother   . Diabetes Maternal Grandmother   . Hypertension  Sister   . Colon cancer Neg Hx     Social History   Tobacco Use  . Smoking status: Never Smoker  . Smokeless tobacco: Never Used  Vaping Use  . Vaping Use: Never used  Substance Use Topics  . Alcohol use: Yes    Alcohol/week: 7.0 standard drinks    Types: 7 Glasses of wine per week    Comment: 1 glass of wine a night  . Drug use: No      Current Outpatient Medications:  .  amoxicillin-clavulanate (AUGMENTIN) 875-125 MG tablet, Take 1 tablet by mouth every 12 (twelve) hours. For sinusitis, Disp: 14 tablet, Rfl: 0 .  Ascorbic Acid (VITAMIN C) 100 MG tablet, Take 100 mg by mouth daily., Disp: , Rfl:  .  Cetirizine HCl 10 MG CAPS, Take 10 mg by mouth as needed. Reported on 03/23/2015, Disp: , Rfl:  .  cholecalciferol (VITAMIN D) 1000 UNITS tablet, Take 2,000 Units by mouth daily., Disp: , Rfl:  .  diphenhydrAMINE (BENADRYL) 25 MG tablet, Take 25 mg by mouth every 6 (six) hours as needed., Disp: , Rfl:  .  Evolocumab (REPATHA SURECLICK) 314 MG/ML SOAJ, Inject 140 mg into the skin every 14 (fourteen) days., Disp: 2.1 mL, Rfl: 11 .  Ferrous Gluconate-C-Folic Acid (IRON-C PO), Take 26 mg by mouth daily., Disp: ,  Rfl:  .  fluticasone (FLONASE) 50 MCG/ACT nasal spray, Place 2 sprays into both nostrils daily., Disp: , Rfl:  .  lisinopril (ZESTRIL) 20 MG tablet, TAKE 1 TABLET BY MOUTH EVERY DAY, Disp: 90 tablet, Rfl: 1 .  pantoprazole (PROTONIX) 40 MG tablet, Take 1 tablet (40 mg total) by mouth daily. Needs appt  For further refill [id number], Disp: 15 tablet, Rfl: 0 .  VITAMIN E PO, Take by mouth., Disp: , Rfl:   EXAM: BP Readings from Last 3 Encounters:  05/18/20 128/80  04/12/20 124/82  09/28/19 120/84    VITALS per patient if applicable:  GENERAL: alert, oriented, appears well and in no acute distress appears congested but no facial edema and normal speech no respiratory distress.  HEENT: atraumatic, conjunttiva clear, no obvious abnormalities on inspection of external nose and  ears  NECK: normal movements of the head and neck  LUNGS: on inspection no signs of respiratory distress, breathing rate appears normal, no obvious gross SOB, gasping or wheezing  CV: no obvious cyanosis  MS: moves all visible extremities without noticeable abnormality  PSYCH/NEURO: pleasant and cooperative, no obvious depression or anxiety, speech and thought processing grossly intact Lab Results  Component Value Date   WBC 6.3 04/12/2020   HGB 12.8 04/12/2020   HCT 38.4 04/12/2020   PLT 325.0 04/12/2020   GLUCOSE 100 (H) 04/12/2020   CHOL 139 02/02/2020   TRIG 124 02/02/2020   HDL 66 02/02/2020   LDLDIRECT 149.9 03/03/2013   LDLCALC 51 02/02/2020   ALT 18 04/12/2020   AST 17 04/12/2020   NA 139 04/12/2020   K 4.5 04/12/2020   CL 102 04/12/2020   CREATININE 0.58 04/12/2020   BUN 11 04/12/2020   CO2 29 04/12/2020   TSH 1.78 04/12/2020   HGBA1C 5.6 04/12/2020    ASSESSMENT AND PLAN:  Discussed the following assessment and plan:    ICD-10-CM   1. Acute sinusitis with symptoms greater than 10 days  J01.90    underlying allergic componenet.   Continue sinus hygiene ,add antibiotic expectant management. Counseled.   Expectant management and discussion of plan and treatment with opportunity to ask questions and all were answered. The patient agreed with the plan and demonstrated an understanding of the instructions.   Advised to call back or seek an in-person evaluation if worsening  or having  further concerns . Return if symptoms worsen or fail to improve as expected.    Shanon Ace, MD

## 2020-06-21 ENCOUNTER — Other Ambulatory Visit: Payer: Self-pay | Admitting: Internal Medicine

## 2020-07-07 ENCOUNTER — Other Ambulatory Visit: Payer: Self-pay | Admitting: Internal Medicine

## 2020-07-07 DIAGNOSIS — Z1231 Encounter for screening mammogram for malignant neoplasm of breast: Secondary | ICD-10-CM

## 2020-07-12 ENCOUNTER — Ambulatory Visit
Admission: RE | Admit: 2020-07-12 | Discharge: 2020-07-12 | Disposition: A | Discharge status: Home / Self Care | Payer: BC Managed Care – PPO | Source: Ambulatory Visit

## 2020-07-12 ENCOUNTER — Other Ambulatory Visit: Payer: Self-pay

## 2020-07-12 DIAGNOSIS — Z1231 Encounter for screening mammogram for malignant neoplasm of breast: Secondary | ICD-10-CM

## 2020-08-02 ENCOUNTER — Other Ambulatory Visit: Payer: Self-pay

## 2020-08-02 DIAGNOSIS — R931 Abnormal findings on diagnostic imaging of heart and coronary circulation: Secondary | ICD-10-CM

## 2020-08-02 DIAGNOSIS — I10 Essential (primary) hypertension: Secondary | ICD-10-CM

## 2020-08-02 DIAGNOSIS — E785 Hyperlipidemia, unspecified: Secondary | ICD-10-CM

## 2020-08-10 ENCOUNTER — Other Ambulatory Visit: Payer: Self-pay | Admitting: Cardiology

## 2020-08-15 ENCOUNTER — Other Ambulatory Visit: Payer: Self-pay | Admitting: Cardiology

## 2020-08-19 ENCOUNTER — Telehealth (HOSPITAL_COMMUNITY): Payer: Self-pay

## 2020-08-19 NOTE — Telephone Encounter (Signed)
Close encounter 

## 2020-08-22 ENCOUNTER — Other Ambulatory Visit (HOSPITAL_COMMUNITY): Payer: BC Managed Care – PPO

## 2020-08-23 ENCOUNTER — Telehealth (HOSPITAL_COMMUNITY): Payer: Self-pay | Admitting: Cardiology

## 2020-08-23 MED ORDER — LISINOPRIL 20 MG PO TABS: 20.0000 mg | ORAL_TABLET | Freq: Every day | ORAL | 0 refills | Status: DC

## 2020-08-23 NOTE — Telephone Encounter (Signed)
Patient sent a Message thru [place] per Patria Mane Rn for reason below:  08/22/2020 2:32 PM IO:[id number], HAYLEY A  Cancel Rsn: Patient (jury duty-mychart message)  Order will be removed form the [facility] WQ and when pt calls back we will reinstate the order. Thank you.

## 2020-08-24 ENCOUNTER — Ambulatory Visit (HOSPITAL_COMMUNITY)
Admission: RE | Admit: 2020-08-24 | Payer: BC Managed Care – PPO | Source: Ambulatory Visit | Attending: Cardiology | Admitting: Cardiology

## 2020-09-01 ENCOUNTER — Telehealth (HOSPITAL_COMMUNITY): Payer: Self-pay | Admitting: *Deleted

## 2020-09-01 NOTE — Telephone Encounter (Signed)
Close encounter 

## 2020-09-02 ENCOUNTER — Ambulatory Visit (HOSPITAL_COMMUNITY)
Admission: RE | Admit: 2020-09-02 | Discharge: 2020-09-02 | Disposition: A | Discharge status: Home / Self Care | Payer: BC Managed Care – PPO | Source: Ambulatory Visit | Attending: Cardiovascular Disease | Admitting: Cardiovascular Disease

## 2020-09-02 ENCOUNTER — Other Ambulatory Visit: Payer: Self-pay

## 2020-09-02 DIAGNOSIS — R931 Abnormal findings on diagnostic imaging of heart and coronary circulation: Secondary | ICD-10-CM | POA: Diagnosis present

## 2020-09-02 LAB — EXERCISE TOLERANCE TEST
Estimated workload: 11.2 METS
Exercise duration (min): 9 min
Exercise duration (sec): 46 s
MPHR: 162 {beats}/min
Peak HR: 155 {beats}/min
Percent HR: 95 %
Rest HR: 74 {beats}/min

## 2020-10-02 NOTE — Progress Notes (Signed)
Cardiology Office Note   Date:  10/03/2020   ID:  Jacqueline Beasley, Jacqueline Beasley 10/23/1960, MRN [id number]  PCP:  Burnis Medin, MD  Cardiologist:   Minus Breeding, MD    Chief Complaint  Patient presents with   Elevated Coronary Calcium       History of Present Illness: Jacqueline Beasley is a 60 y.o. female who presents for evaluation of chest pain.  At the first appt in 2019 I performed a POET (Plain Old Exercise Treadmill).  She exercised for 11 minutes without any ischemic ST T wave changes.  She had a negative POET (Plain Old Exercise Treadmill) last month.   She had an excellent exercise tolerance.  Of note she did not tolerate statin.  She is now on bempedoic acid plus Zetia.  She had a negative POET (Plain Old Exercise Treadmill) in July of this year.   Since I saw her she has done well.  She did a treadmill in July and she was able to go 9 minutes and 46 seconds.  She had a bit of a hypertensive response but there were no other abnormalities. The patient denies any new symptoms such as chest discomfort, neck or arm discomfort. There has been no new shortness of breath, PND or orthopnea. There have been no reported palpitations, presyncope or syncope.   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    AUGMENTATION MAMMAPLASTY     BREAST ENHANCEMENT SURGERY  2010   CESAREAN SECTION  [id number]     Current Outpatient Medications  Medication Sig Dispense Refill   Ascorbic Acid (VITAMIN C) 100 MG tablet Take 100 mg by mouth daily.     Cetirizine HCl 10 MG CAPS Take 10 mg by mouth as needed. Reported on 03/23/2015     cholecalciferol (VITAMIN D) 1000 UNITS tablet Take  2,000 Units by mouth daily.     diphenhydrAMINE (BENADRYL) 25 MG tablet Take 25 mg by mouth every 6 (six) hours as needed.     Ferrous Gluconate-C-Folic Acid (IRON-C PO) Take 26 mg by mouth daily.     fluticasone (FLONASE) 50 MCG/ACT nasal spray Place 2 sprays into both nostrils daily.     lisinopril (ZESTRIL) 20 MG tablet Take 1 tablet (20 mg total) by mouth daily. 90 tablet 0   pantoprazole (PROTONIX) 40 MG tablet TAKE 1 TABLET BY MOUTH EVERY DAY *NEED APPT FOR FURTHER REFILLS* 90 tablet 1   REPATHA SURECLICK [id number] MG/ML SOAJ INJECT 140 MG INTO THE SKIN EVERY 14 (FOURTEEN) DAYS. 2 mL 11   VITAMIN E PO Take by mouth.     amoxicillin-clavulanate (AUGMENTIN) 875-125 MG tablet Take 1 tablet by mouth every 12 (twelve) hours. For sinusitis (Patient not taking: Reported on 10/03/2020) 14 tablet 0   No current facility-administered medications for this visit.    Allergies:   Ambien [zolpidem]    ROS:  Please see the history of present illness.   Otherwise, review of systems are positive for none.   All other systems are reviewed and  negative.    PHYSICAL EXAM: VS:  BP 140/77   Pulse 76   Ht '5\' 2"'$  (1.575 m)   Wt 172 lb 9.6 oz (78.3 kg)   SpO2 98%   BMI 31.57 kg/m  , BMI Body mass index is 31.57 kg/m.  GENERAL:  Well appearing NECK:  No jugular venous distention, waveform within normal limits, carotid upstroke brisk and symmetric, no bruits, no thyromegaly LUNGS:  Clear to auscultation bilaterally CHEST:  Unremarkable HEART:  PMI not displaced or sustained,S1 and S2 within normal limits, no S3, no S4, no clicks, no rubs, no murmurs ABD:  Flat, positive bowel sounds normal in frequency in pitch, no bruits, no rebound, no guarding, no midline pulsatile mass, no hepatomegaly, no splenomegaly EXT:  2 plus pulses throughout, no edema, no cyanosis no clubbing   EKG:  EKG is  ordered today. Sinus rhythm, rate 76, axis within normal limits, intervals within normal limits, no acute ST-T wave  changes.   Recent Labs: 04/12/2020: ALT 18; BUN 11; Creatinine, Ser 0.58; Hemoglobin 12.8; Platelets 325.0; Potassium 4.5; Sodium 139; TSH 1.78    Lipid Panel    Component Value Date/Time   CHOL 139 02/02/2020 1026   TRIG 124 02/02/2020 1026   HDL 66 02/02/2020 1026   CHOLHDL 3.4 09/28/2019 1027   CHOLHDL 3 08/13/2018 1106   VLDL 20.4 08/13/2018 1106   LDLCALC 51 02/02/2020 1026   LDLDIRECT 149.9 03/03/2013 1106      Wt Readings from Last 3 Encounters:  10/03/20 172 lb 9.6 oz (78.3 kg)  05/18/20 165 lb (74.8 kg)  04/12/20 172 lb (78 kg)      Other studies Reviewed: Additional studies/ records that were reviewed today include: Labs  Review of the above records demonstrates:    See elsewhere   ASSESSMENT AND PLAN:  ELEVATED CORONARY CALCIUM:    She had a negative POET (Plain Old Exercise Treadmill).  She will continue with primary risk reduction  DYSLIPIDEMIA:     She has not tolerated statin and was put on Nexlizet.  She is now on Repatha and her LDL was 51 with an HDL of 66.  No change in therapy.   HTN:  The blood pressure is mildly elevated and she is going to keep a blood pressure diary.  No change in therapy.   Current medicines are reviewed at length with the patient today.  The patient does not have concerns regarding medicines.  The following changes have been made:   None  Labs/ tests ordered today include:     Orders Placed This Encounter  Procedures   EKG 12-Lead      Disposition:   FU with me 1 year   Signed, Minus Breeding, MD  10/03/2020 12:32 PM    [facility]

## 2020-10-03 ENCOUNTER — Other Ambulatory Visit: Payer: Self-pay

## 2020-10-03 ENCOUNTER — Ambulatory Visit: Payer: BC Managed Care – PPO | Admitting: Cardiology

## 2020-10-03 ENCOUNTER — Encounter: Payer: Self-pay | Admitting: Cardiology

## 2020-10-03 VITALS — BP 140/77 | HR 76 | Ht 62.0 in | Wt 172.6 lb

## 2020-10-03 DIAGNOSIS — E785 Hyperlipidemia, unspecified: Secondary | ICD-10-CM

## 2020-10-03 DIAGNOSIS — R931 Abnormal findings on diagnostic imaging of heart and coronary circulation: Secondary | ICD-10-CM | POA: Diagnosis not present

## 2020-10-03 DIAGNOSIS — I10 Essential (primary) hypertension: Secondary | ICD-10-CM | POA: Diagnosis not present

## 2020-10-03 NOTE — Patient Instructions (Signed)
Medication Instructions:  Your physician recommends that you continue on your current medications as directed. Please refer to the Current Medication list given to you today.  *If you need a refill on your cardiac medications before your next appointment, please call your pharmacy*   Follow-Up: At [facility], you and your health needs are our priority.  As part of our continuing mission to provide you with exceptional heart care, we have created designated Provider Care Teams.  These Care Teams include your primary Cardiologist (physician) and Advanced Practice Providers (APPs -  Physician Assistants and Nurse Practitioners) who all work together to provide you with the care you need, when you need it.  We recommend signing up for the patient portal called "MyChart".  Sign up information is provided on this After Visit Summary.  MyChart is used to connect with patients for Virtual Visits (Telemedicine).  Patients are able to view lab/test results, encounter notes, upcoming appointments, etc.  Non-urgent messages can be sent to your provider as well.   To learn more about what you can do with MyChart, go to [email or web address].    Your next appointment:   12 month(s)  The format for your next appointment:   In Person  Provider:   You may see Dr. Percival Spanish or one of the following Advanced Practice Providers on your designated Care Team:   Rosaria Ferries, PA-C Caron Presume, PA-C Jory Sims, DNP, ANP   Other Instructions

## 2020-11-18 ENCOUNTER — Other Ambulatory Visit: Payer: Self-pay | Admitting: Cardiology

## 2020-12-18 ENCOUNTER — Other Ambulatory Visit: Payer: Self-pay | Admitting: Internal Medicine

## 2021-02-17 ENCOUNTER — Encounter: Payer: Self-pay | Admitting: Cardiology

## 2021-02-20 ENCOUNTER — Other Ambulatory Visit: Payer: Self-pay

## 2021-02-20 MED ORDER — LISINOPRIL 40 MG PO TABS: 40.0000 mg | ORAL_TABLET | Freq: Every day | ORAL | 3 refills | Status: DC

## 2021-02-20 NOTE — Progress Notes (Signed)
Prescription sent to pharmacy.

## 2021-02-25 IMAGING — MG DIGITAL SCREENING BREAST BILAT IMPLANT W/ TOMO W/ CAD
8 of 12 series · 8 of 28 positions shown · non-contrast
Comparison: Previous exam(s).

CLINICAL DATA: Screening.

EXAM:
DIGITAL SCREENING BILATERAL MAMMOGRAM WITH IMPLANTS, CAD AND TOMO

[L MLO]
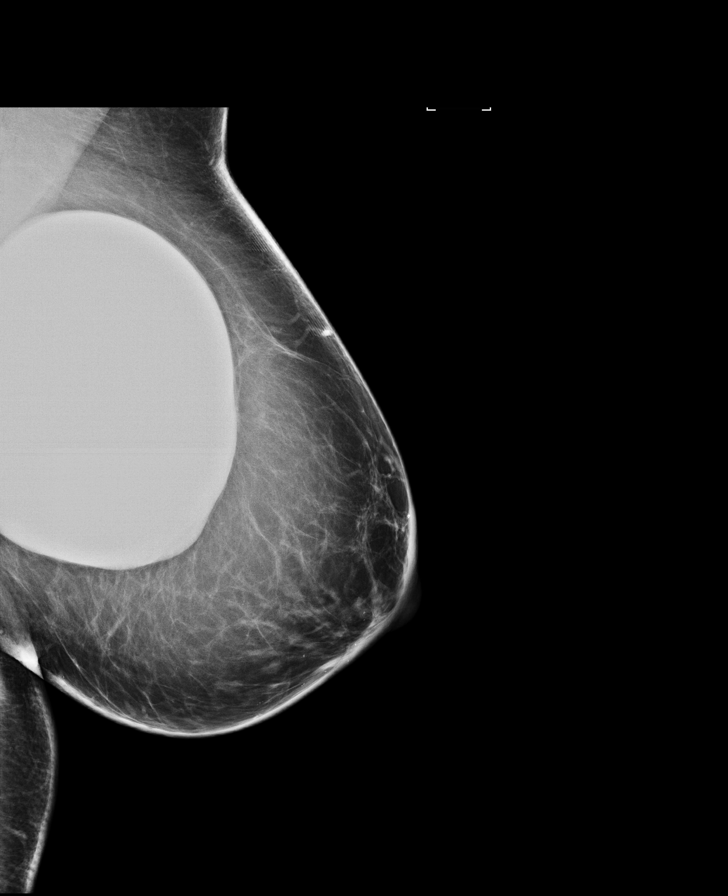

[L CC]
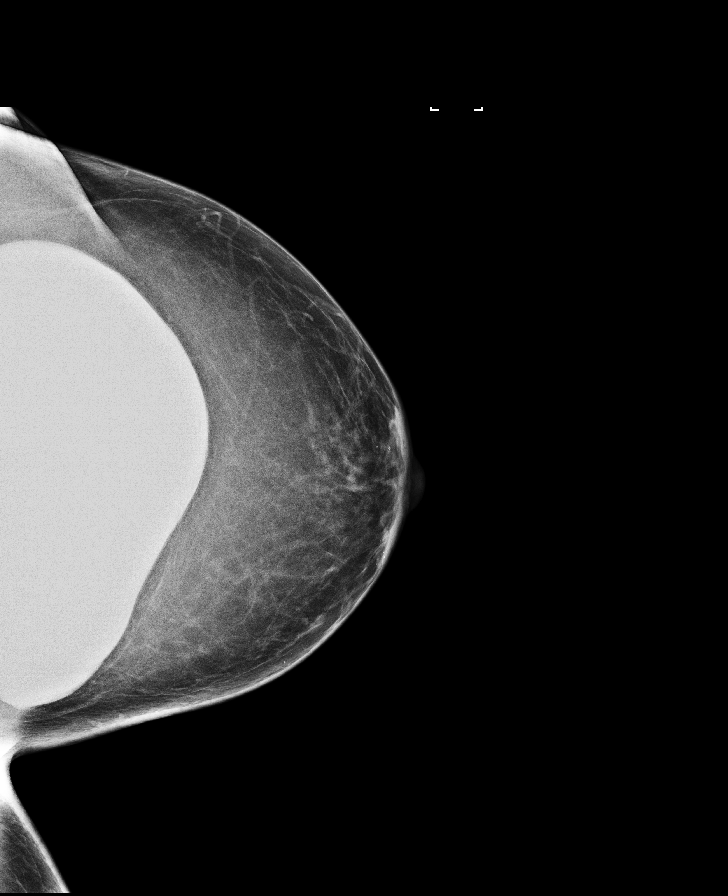

[R MLO]
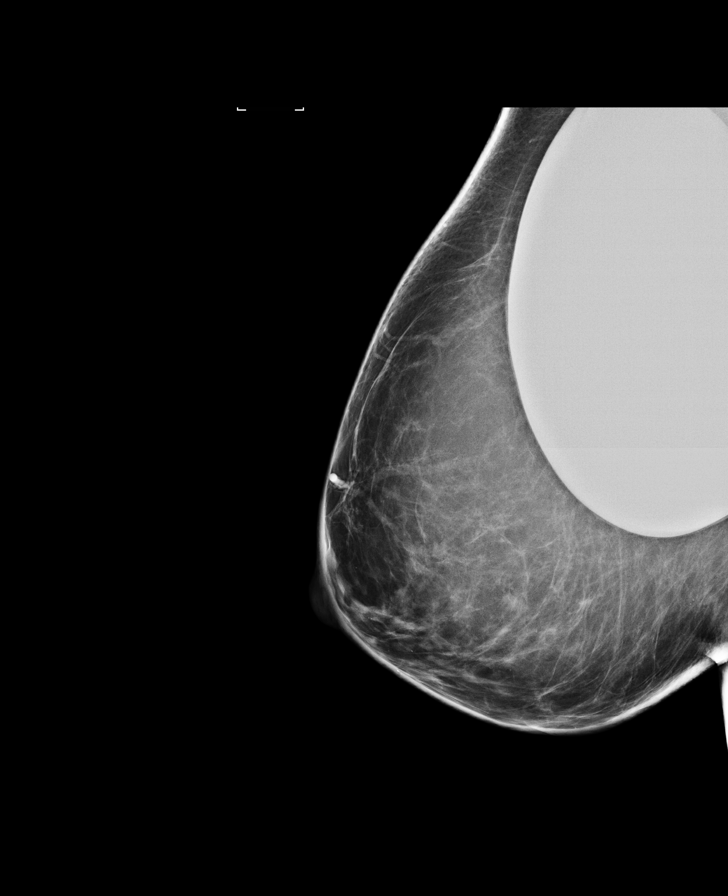

[R CC]
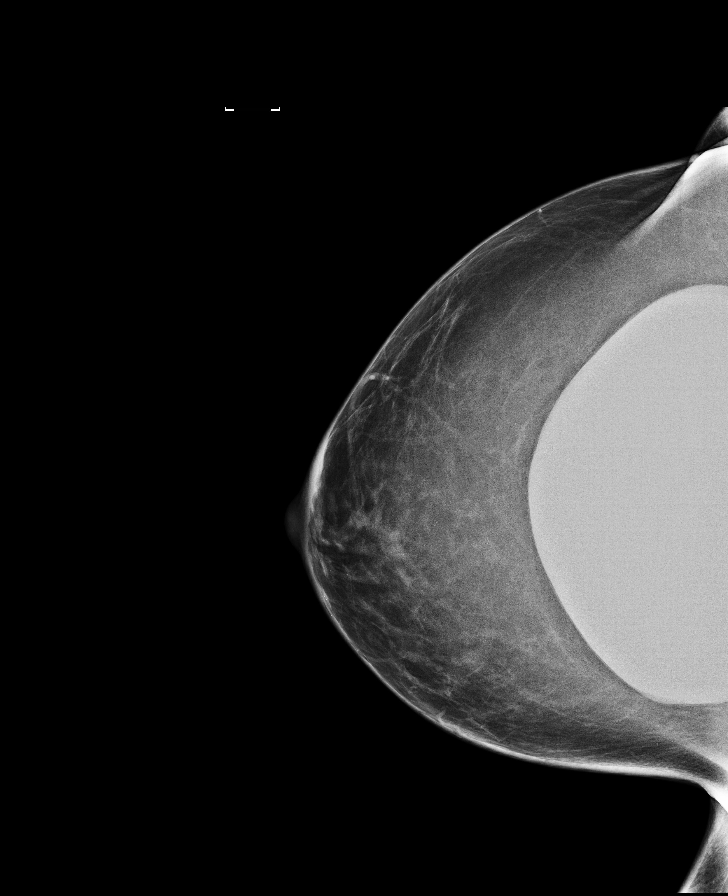

[R CC synth-2D]
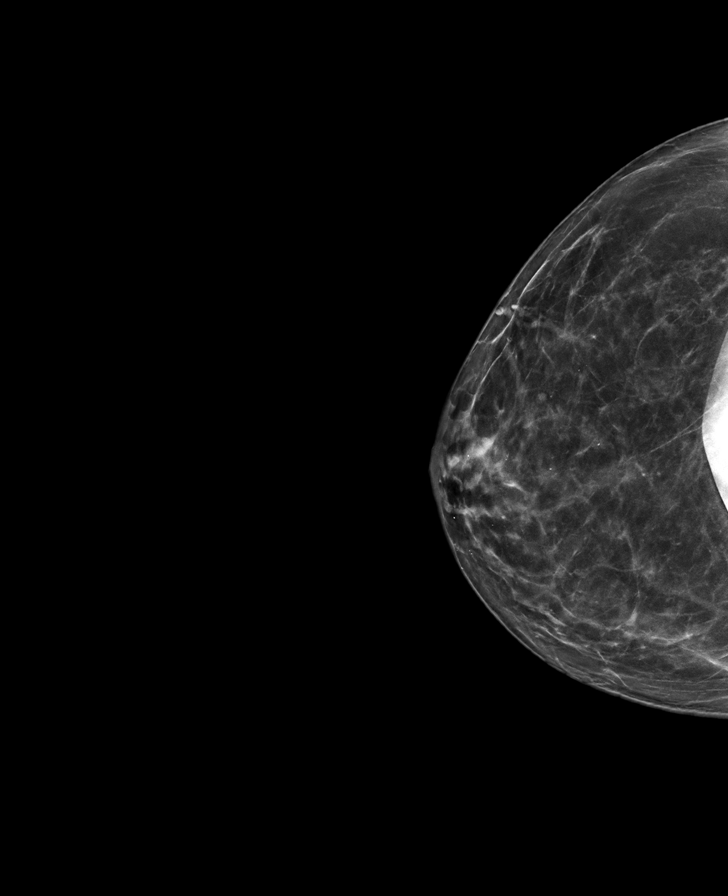

[R MLO synth-2D]
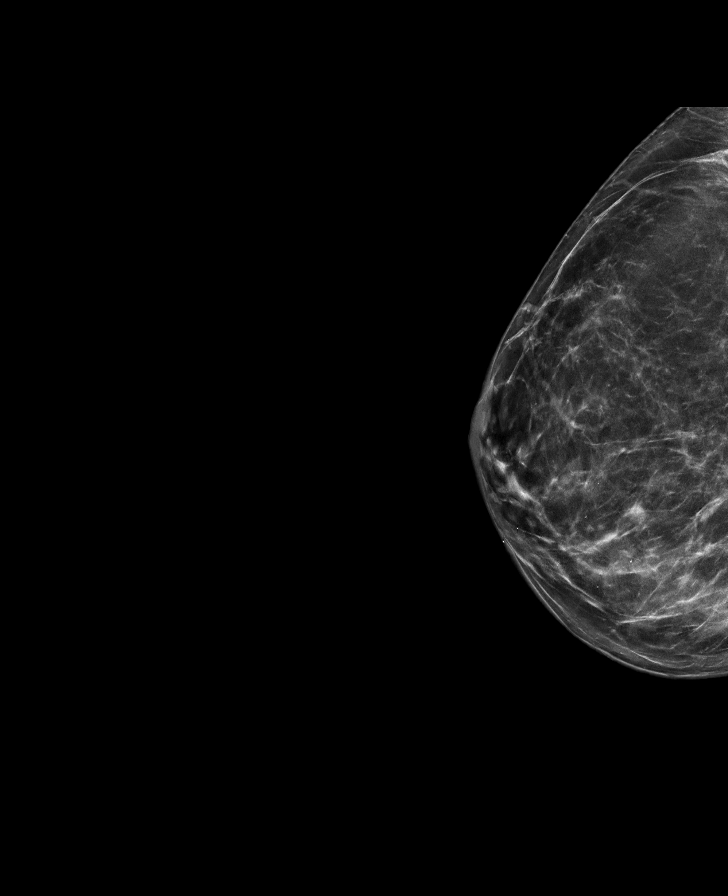

[L MLO synth-2D]
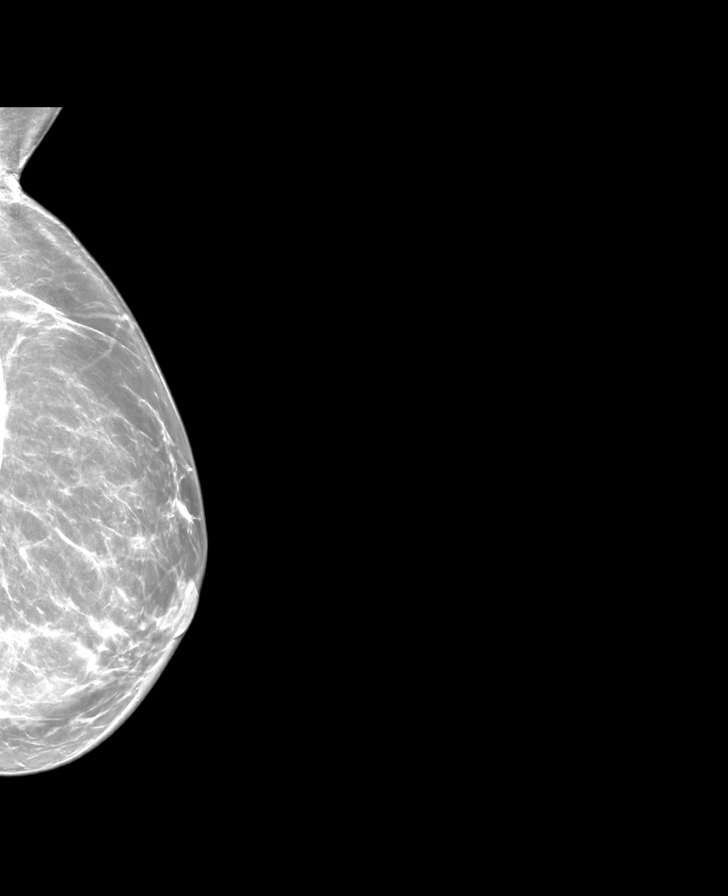

[L CC synth-2D]
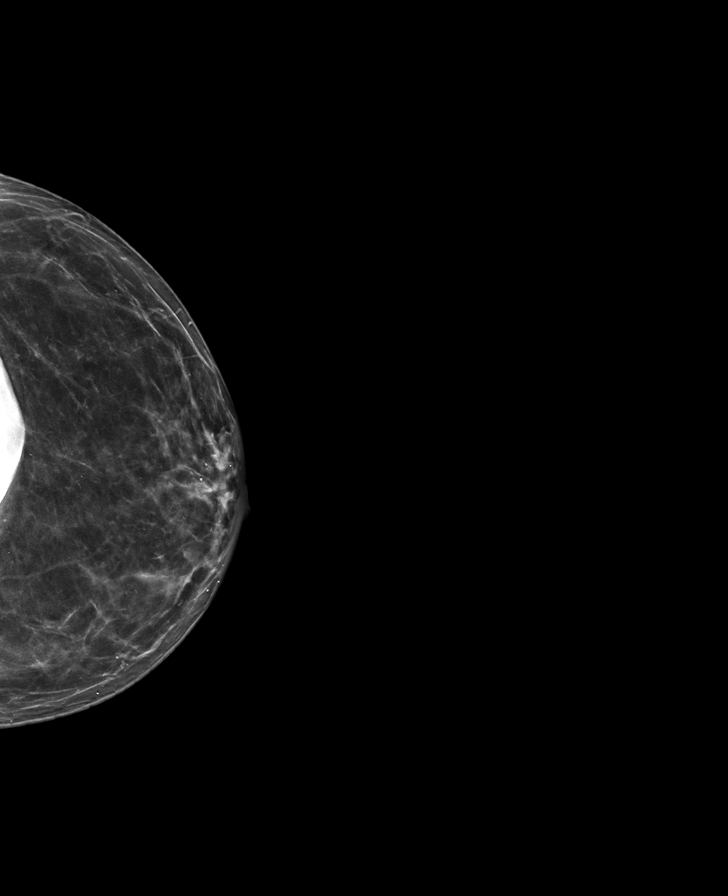

[8 of 28 positions shown; findings below may reference images not displayed]

ACR Breast Density Category b: There are scattered areas of
fibroglandular density.
FINDINGS: In the right breast, a possible masses warrant further evaluation.
In the left breast, no findings suspicious for malignancy. Images
were processed with CAD.
IMPRESSION: Further evaluation is suggested for possible mass in the right
breast.

RECOMMENDATION:
Diagnostic mammogram and possibly ultrasound of the right breast.
(Code:[id number])

The patient will be contacted regarding the findings, and additional
imaging will be scheduled.

BI-RADS CATEGORY  0: Incomplete. Need additional imaging evaluation
and/or prior mammograms for comparison.

## 2021-04-04 ENCOUNTER — Encounter: Payer: Self-pay | Admitting: Internal Medicine

## 2021-04-04 NOTE — Telephone Encounter (Signed)
Pt has been added to schedule on 04/05/2021

## 2021-04-04 NOTE — Progress Notes (Signed)
Chief Complaint  Patient presents with   Vaginal Pain    Started 1/1 uncomfortable sitting, retaining fluid and all of a sudden urgency to go    HPI: Jacqueline Beasley 61 y.o. come in for   problem   last visit pov jan 22   For weeks has had a nagging pain   r side  perineal area  ? Not sure if gu or vag. And now  all the time and raw area .  And sitting  uncomfortable.  Around rectal area . Uses hair removal   but this is different and not typical  irritation  Used  "everything" hemorroid topical diaper rash  topical ,  neosporin   but not  yeast rx.  No vag dc  No fever  some chills at times.   Some  uncomfortable suprapubic.  Not worse with  urination.    States had perineal hair and uses  chemical removal  and sometimes gets hair bumps but this is different.  ROS: See pertinent positives and negatives per HPI.  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   Back pain    Recurrent has seen neurosurgery in past   History of anal fissures    pt has itching with this at times   Hx of varicella    Hypertension     Family History  Problem Relation Age of Onset   Cancer Mother        Uterus   Stroke Mother        died 37    Hypertension Mother    Hypertension Sister    Diabetes Maternal Grandmother    Colon cancer Neg Hx     Social History   Socioeconomic History   Marital status: Married    Spouse name: Not on file   Number of children: 2   Years of education: Not on file   Highest education level: Some college, no degree  Occupational History   Not on file  Tobacco Use   Smoking status: Never   Smokeless tobacco: Never  Vaping Use   Vaping Use: Never used  Substance and Sexual Activity   Alcohol use: Yes    Alcohol/week: 7.0 standard drinks    Types: 7 Glasses of wine per week    Comment: 1 glass of wine a night   Drug use: No   Sexual activity: Yes  Other Topics Concern   Not on file  Social History  Narrative   Lives at home with husband.   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    G3 P2   Neg ets    Social Determinants of Health   Financial Resource Strain: Low Risk    Difficulty of Paying Living Expenses: Not hard at all  Food Insecurity: No Food Insecurity   Worried About Charity fundraiser in the Last Year: Never true   Arboriculturist in the Last Year: Never true  Transportation Needs: No Transportation Needs   Lack of Transportation (Medical): No   Lack of Transportation (Non-Medical): No  Physical Activity: Sufficiently Active   Days of Exercise per Week: 5 days   Minutes of Exercise per Session: 30 min  Stress: No Stress Concern Present   Feeling of Stress : Not at all  Social Connections: Socially Integrated   Frequency of  Communication with Friends and Family: More than three times a week   Frequency of Social Gatherings with Friends and Family: More than three times a week   Attends Religious Services: More than 4 times per year   Active Member of Clubs or Organizations: Yes   Attends Music therapist: More than 4 times per year   Marital Status: Married    Outpatient Medications Prior to Visit  Medication Sig Dispense Refill   Ascorbic Acid (VITAMIN C) 100 MG tablet Take 100 mg by mouth daily.     Cetirizine HCl 10 MG CAPS Take 10 mg by mouth as needed. Reported on 03/23/2015     cholecalciferol (VITAMIN D) 1000 UNITS tablet Take 2,000 Units by mouth daily.     diphenhydrAMINE (BENADRYL) 25 MG tablet Take 25 mg by mouth every 6 (six) hours as needed.     Ferrous Gluconate-C-Folic Acid (IRON-C PO) Take 26 mg by mouth daily.     fluticasone (FLONASE) 50 MCG/ACT nasal spray Place 2 sprays into both nostrils daily.     lisinopril (ZESTRIL) 40 MG tablet Take 1 tablet (40 mg total) by mouth daily. 90 tablet 3   pantoprazole (PROTONIX) 40 MG tablet TAKE 1 TABLET BY MOUTH EVERY DAY *NEED  APPT FOR FURTHER REFILLS* 90 tablet 1   REPATHA SURECLICK 976 MG/ML SOAJ INJECT 140 MG INTO THE SKIN EVERY 14 (FOURTEEN) DAYS. 2 mL 11   VITAMIN E PO Take by mouth.     amoxicillin-clavulanate (AUGMENTIN) 875-125 MG tablet Take 1 tablet by mouth every 12 (twelve) hours. For sinusitis 14 tablet 0   No facility-administered medications prior to visit.     EXAM:  BP 122/82 (BP Location: Left Arm, Patient Position: Sitting, Cuff Size: Normal)    Pulse 74    Temp 98.6 F (37 C) (Oral)    Ht 5\' 2"  (1.575 m)    Wt 174 lb 9.6 oz (79.2 kg)    SpO2 100%    BMI 31.93 kg/m   Body mass index is 31.93 kg/m.  GENERAL: vitals reviewed and listed above, alert, oriented, appears well hydrated and in no acute distress HEENT: atraumatic, conjunctiva  clear, no obvious abnormalities on inspection of external nose and ears OP : masked  NECK: no obvious masses on inspection palpation  Abd no acute finding Ext GU  labia clear  pink vag mucosa  perineal redness and inflammation  and  2+ perianal redness without edema   some satellite type lesions   no pustules.     Small hemorrhoidal tag  npo ulcer or bleeding  MS: moves all extremities without noticeable focal  abnormality PSYCH: pleasant and cooperative, no obvious depression or anxiety Lab Results  Component Value Date   WBC 6.3 04/12/2020   HGB 12.8 04/12/2020   HCT 38.4 04/12/2020   PLT 325.0 04/12/2020   GLUCOSE 100 (H) 04/12/2020   CHOL 139 02/02/2020   TRIG 124 02/02/2020   HDL 66 02/02/2020   LDLDIRECT 149.9 03/03/2013   LDLCALC 51 02/02/2020   ALT 18 04/12/2020   AST 17 04/12/2020   NA 139 04/12/2020   K 4.5 04/12/2020   CL 102 04/12/2020   CREATININE 0.58 04/12/2020   BUN 11 04/12/2020   CO2 29 04/12/2020   TSH 1.78 04/12/2020   HGBA1C 5.6 04/12/2020   BP Readings from Last 3 Encounters:  04/05/21 122/82  10/03/20 140/77  05/18/20 128/80    ASSESSMENT AND PLAN:  Discussed the following assessment and plan:  Perianal rash -  Plan: Cervicovaginal ancillary only, POC Urinalysis Dipstick, Cervicovaginal ancillary only  Vaginal pain - Plan: Cervicovaginal ancillary only, POC Urinalysis Dipstick, Cervicovaginal ancillary only Looks like possible years    vag swab obtained   but also concerna bout  perianal dermatitis that could be bacterial   with pain and itching  .  No gi or other gu sx . Empiric  with antibiotic , ttopical yeast  and hcs if needed  Cleanse gentle  ( baby wipes type or compresses .)   Fu keep [place] updated if  persistent or progressive  -Patient advised to return or notify health care team  if  new concerns arise.  Patient Instructions  Checking for vaginal  yeast   but this seems more like  inflammation around  anal area and perineal. This can be caused but  irritation yeast and bacteria .  Add topical antiyeast cream to use twice a day .  Hydrocortisone 1% otc if needed for itching . Add antibiotic in case bacterial skin infection.  Urine is clear today .  Vaginal swab will probably.be negative.        Standley Brooking. Shonnie Poudrier M.D.

## 2021-04-05 ENCOUNTER — Encounter: Payer: Self-pay | Admitting: Internal Medicine

## 2021-04-05 ENCOUNTER — Ambulatory Visit: Payer: BC Managed Care – PPO | Admitting: Internal Medicine

## 2021-04-05 ENCOUNTER — Other Ambulatory Visit (HOSPITAL_COMMUNITY)
Admission: RE | Admit: 2021-04-05 | Discharge: 2021-04-05 | Disposition: A | Discharge status: Home / Self Care | Payer: BC Managed Care – PPO | Source: Ambulatory Visit | Attending: Internal Medicine | Admitting: Internal Medicine

## 2021-04-05 VITALS — BP 122/82 | HR 74 | Temp 98.6°F | Ht 62.0 in | Wt 174.6 lb

## 2021-04-05 DIAGNOSIS — R102 Pelvic and perineal pain: Secondary | ICD-10-CM | POA: Diagnosis present

## 2021-04-05 DIAGNOSIS — R931 Abnormal findings on diagnostic imaging of heart and coronary circulation: Secondary | ICD-10-CM

## 2021-04-05 DIAGNOSIS — R21 Rash and other nonspecific skin eruption: Secondary | ICD-10-CM | POA: Diagnosis not present

## 2021-04-05 LAB — POCT URINALYSIS DIPSTICK
Bilirubin, UA: NEGATIVE
Blood, UA: NEGATIVE
Glucose, UA: NEGATIVE
Ketones, UA: NEGATIVE
Leukocytes, UA: NEGATIVE
Nitrite, UA: NEGATIVE
Protein, UA: NEGATIVE
Spec Grav, UA: 1.02 (ref 1.010–1.025)
Urobilinogen, UA: 0.2 E.U./dL
pH, UA: 6 (ref 5.0–8.0)

## 2021-04-05 MED ORDER — AMOXICILLIN 500 MG PO CAPS: 500.0000 mg | ORAL_CAPSULE | Freq: Three times a day (TID) | ORAL | 0 refills | Status: DC

## 2021-04-05 MED ORDER — MICONAZOLE NITRATE 2 % EX CREA: 1.0000 "application " | TOPICAL_CREAM | Freq: Two times a day (BID) | CUTANEOUS | 0 refills | Status: DC

## 2021-04-05 NOTE — Patient Instructions (Addendum)
Checking for vaginal  yeast   but this seems more like  inflammation around  anal area and perineal. This can be caused but  irritation yeast and bacteria .  Add topical antiyeast cream to use twice a day .  Hydrocortisone 1% otc if needed for itching . Add antibiotic in case bacterial skin infection.  Urine is clear today .  Vaginal swab will probably.be negative.

## 2021-04-06 ENCOUNTER — Encounter: Payer: Self-pay | Admitting: Internal Medicine

## 2021-04-06 LAB — CERVICOVAGINAL ANCILLARY ONLY
Bacterial Vaginitis (gardnerella): NEGATIVE
Candida Glabrata: NEGATIVE
Candida Vaginitis: NEGATIVE
Comment: NEGATIVE
Comment: NEGATIVE
Comment: NEGATIVE
Comment: NEGATIVE
Trichomonas: NEGATIVE

## 2021-04-09 NOTE — Progress Notes (Signed)
Vaginal swab is negative for yeast

## 2021-04-10 ENCOUNTER — Telehealth: Payer: BC Managed Care – PPO | Admitting: Internal Medicine

## 2021-04-10 ENCOUNTER — Encounter: Payer: Self-pay | Admitting: Internal Medicine

## 2021-04-10 VITALS — Ht 62.0 in | Wt 174.0 lb

## 2021-04-10 DIAGNOSIS — Z79899 Other long term (current) drug therapy: Secondary | ICD-10-CM

## 2021-04-10 DIAGNOSIS — R21 Rash and other nonspecific skin eruption: Secondary | ICD-10-CM | POA: Diagnosis not present

## 2021-04-10 NOTE — Telephone Encounter (Signed)
Stop the amoxiciliin . Take a picture of the rash  Add some benadryl.   See if can make a virtual visit .

## 2021-04-10 NOTE — Progress Notes (Signed)
Virtual Visit via Video Note  I connected with Jacqueline Beasley on 04/10/21 at  4:00 PM EST by a video enabled telemedicine application and verified that I am speaking with the correct person using two identifiers. Location patient: home work  Environmental [employer] office Persons participating in the virtual visit: patient, provider  [place] national recommendations  regarding COVID 19 pandemic   video visit is advised over in office visit for this patient.  Patient aware  of the limitations of evaluation and management by telemedicine and  availability of in person appointments. and agreed to proceed.   HPI: Jacqueline Beasley presents for video visit she began amoxicillin and topical yeast medicine last week when she was seen on February 22 for perianal dermatitis plus infection and itching pain. A few days later she started breaking out on her distal arms and below the knees with itchy diffuse rash especially behind her calves and inner elbows.  No known exposure although she does have dogs and outside time.  There were no hives or whelps fever systemic symptoms.  The perineal dermatitis is much better she states.  She sent a message in today with pictures this is day 6 was told to stop medicine in case.  There was never any rash on her torso or otherwise clothed  area. Overall she is not sure the medicine causing this but has not had this before She does get weekly vitamin injections at a health bar because it "makes her feel better" but the rash began before she had the injection.  ROS: See pertinent positives and negatives per HPI.  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     AUGMENTATION MAMMAPLASTY     BREAST ENHANCEMENT SURGERY  2010   CESAREAN SECTION  [id number]    Family History  Problem Relation Age of Onset   Cancer Mother        Uterus   Stroke Mother        died 50    Hypertension Mother    Hypertension Sister    Diabetes Maternal Grandmother    Colon cancer Neg Hx     Social History   Tobacco Use   Smoking status: Never   Smokeless tobacco: Never  Vaping Use   Vaping Use: Never used  Substance Use Topics   Alcohol use: Yes    Alcohol/week: 7.0 standard drinks    Types: 7 Glasses of wine per week    Comment: 1 glass of wine a night   Drug use: No      Current Outpatient Medications:    Ascorbic Acid (VITAMIN C) 100 MG tablet, Take 100 mg by mouth daily., Disp: , Rfl:    Cetirizine HCl 10 MG CAPS, Take 10 mg by mouth as needed. Reported on 03/23/2015, Disp: , Rfl:    cholecalciferol (VITAMIN D) 1000 UNITS tablet, Take 2,000 Units by mouth daily., Disp: , Rfl:    diphenhydrAMINE (BENADRYL) 25 MG tablet, Take 25 mg by mouth every 6 (six) hours as needed., Disp: , Rfl:  Ferrous Gluconate-C-Folic Acid (IRON-C PO), Take 26 mg by mouth daily., Disp: , Rfl:    fluticasone (FLONASE) 50 MCG/ACT nasal spray, Place 2 sprays into both nostrils daily., Disp: , Rfl:    lisinopril (ZESTRIL) 40 MG tablet, Take 1 tablet (40 mg total) by mouth daily., Disp: 90 tablet, Rfl: 3   miconazole (MICOTIN) 2 % cream, Apply 1 application topically 2 (two) times daily., Disp: 28.35 g, Rfl: 0   pantoprazole (PROTONIX) 40 MG tablet, TAKE 1 TABLET BY MOUTH EVERY DAY *NEED APPT FOR FURTHER REFILLS*, Disp: 90 tablet, Rfl: 1   REPATHA SURECLICK 017 MG/ML SOAJ, INJECT 140 MG INTO THE SKIN EVERY 14 (FOURTEEN) DAYS., Disp: 2 mL, Rfl: 11   VITAMIN E PO, Take by mouth., Disp: , Rfl:   EXAM: BP Readings from Last 3 Encounters:  04/05/21 122/82  10/03/20 140/77  05/18/20 128/80    VITALS per patient if applicable:  GENERAL: alert, oriented, appears well and in no  acute distress  HEENT: atraumatic, conjunttiva clear, no obvious abnormalities on inspection of external nose and ears NECK: normal movements of the head and neck LUNGS: on inspection no signs of respiratory distress, breathing rate appears normal, no obvious gross SOB, gasping or wheezing CV: no obvious cyanosis Rash difficult to see but seems diffuse forearms no facial edema palms and soles are clear See pictures at MyChart sent in. MS: moves all visible extremities without noticeable abnormality  PSYCH/NEURO: pleasant and cooperative, no obvious depression or anxiety, speech and thought processing grossly intact Lab Results  Component Value Date   WBC 6.3 04/12/2020   HGB 12.8 04/12/2020   HCT 38.4 04/12/2020   PLT 325.0 04/12/2020   GLUCOSE 100 (H) 04/12/2020   CHOL 139 02/02/2020   TRIG 124 02/02/2020   HDL 66 02/02/2020   LDLDIRECT 149.9 03/03/2013   LDLCALC 51 02/02/2020   ALT 18 04/12/2020   AST 17 04/12/2020   NA 139 04/12/2020   K 4.5 04/12/2020   CL 102 04/12/2020   CREATININE 0.58 04/12/2020   BUN 11 04/12/2020   CO2 29 04/12/2020   TSH 1.78 04/12/2020   HGBA1C 5.6 04/12/2020    ASSESSMENT AND PLAN:  Discussed the following assessment and plan:    ICD-10-CM   1. Rash poss med reaction  R21     2. Medication management  Z79.899      Timing is consistent with medication reaction rash although is on a number of medicines.  It is not typical and distribution but could be consistent with an amoxicillin drug rash none on IgE or allergic.  Fortunately her perineal dermatitis is improving day 6 have her stop the amoxicillin continue local care give [place] a progress report in 2 days.  Can take Benadryl on irritating topicals avoid injections at this point until better. If relapsing perineal dermatitis could choose a different medication. Counseled.   Expectant management and discussion of plan and treatment with opportunity to ask questions and all were answered. The  patient agreed with the plan and demonstrated an understanding of the instructions.   Advised to call back or seek an in-person evaluation if worsening  or having  further concerns  in interim. Return for Updated status in 2 days.  Or as needed.    Shanon Ace, MD

## 2021-04-10 NOTE — Telephone Encounter (Signed)
I spoke with the pt and informed her of the message below and pt verbalized understanding. Pt has been scheduled for Virtual visit this afternoon.

## 2021-04-14 ENCOUNTER — Encounter: Payer: Self-pay | Admitting: Internal Medicine

## 2021-04-14 NOTE — Telephone Encounter (Signed)
Noted  

## 2021-06-17 ENCOUNTER — Other Ambulatory Visit: Payer: Self-pay | Admitting: Internal Medicine

## 2021-06-30 ENCOUNTER — Encounter: Payer: Self-pay | Admitting: Internal Medicine

## 2021-07-11 ENCOUNTER — Other Ambulatory Visit: Payer: Self-pay

## 2021-07-11 ENCOUNTER — Encounter: Payer: Self-pay | Admitting: Internal Medicine

## 2021-07-11 ENCOUNTER — Ambulatory Visit: Payer: BC Managed Care – PPO | Admitting: Internal Medicine

## 2021-07-11 VITALS — BP 110/80 | HR 91 | Temp 98.5°F | Ht 62.0 in | Wt 170.6 lb

## 2021-07-11 DIAGNOSIS — J22 Unspecified acute lower respiratory infection: Secondary | ICD-10-CM | POA: Diagnosis not present

## 2021-07-11 DIAGNOSIS — B951 Streptococcus, group B, as the cause of diseases classified elsewhere: Secondary | ICD-10-CM | POA: Diagnosis not present

## 2021-07-11 LAB — POCT RAPID STREP A (OFFICE): Rapid Strep A Screen: POSITIVE — AB

## 2021-07-11 LAB — POCT INFLUENZA A/B: Influenza A, POC: NEGATIVE

## 2021-07-11 LAB — POC COVID19 BINAXNOW: SARS Coronavirus 2 Ag: NEGATIVE

## 2021-07-11 MED ORDER — CEFUROXIME AXETIL 250 MG PO TABS: 250.0000 mg | ORAL_TABLET | Freq: Two times a day (BID) | ORAL | 0 refills | Status: AC

## 2021-07-11 MED ORDER — BENZONATATE 100 MG PO CAPS: 100.0000 mg | ORAL_CAPSULE | Freq: Three times a day (TID) | ORAL | 0 refills | Status: DC | PRN

## 2021-07-11 MED ORDER — REPATHA SURECLICK 140 MG/ML ~~LOC~~ SOAJ: 140.0000 mg | SUBCUTANEOUS | 11 refills | Status: DC

## 2021-07-11 NOTE — Patient Instructions (Addendum)
Throat rapid  strep test is positive  ; although usually not causing that much coughing  .  Will treat for strep.    Continue fluids  mucinex dm ok .  No signs of pneumonia today .   Covid flu tests are negative  Expect improvement in the next 7-10 days  .

## 2021-07-11 NOTE — Progress Notes (Signed)
Chief Complaint  Patient presents with   Sore Throat    HPI: Jacqueline Beasley 61 y.o. come in for   new sx visit  for resp sx  on going .  Onset mothers day May 14  with sore throat  and then developed cough I no nasal congestion  st off and on but can be severe and radiate to ears .   Cough is a problem using muc  Dm some help.  Initially productive and not now   Husband was sick before her lasted about a .  A month .  ROS: See pertinent positives and negatives per HPI. 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   Back pain    Recurrent has seen neurosurgery in past   History of anal fissures    pt has itching with this at times   Hx of varicella    Hypertension     Family History  Problem Relation Age of Onset   Cancer Mother        Uterus   Stroke Mother        died 71    Hypertension Mother    Hypertension Sister    Diabetes Maternal Grandmother    Colon cancer Neg Hx     Social History   Socioeconomic History   Marital status: Married    Spouse name: Not on file   Number of children: 2   Years of education: Not on file   Highest education level: Some college, no degree  Occupational History   Not on file  Tobacco Use   Smoking status: Never   Smokeless tobacco: Never  Vaping Use   Vaping Use: Never used  Substance and Sexual Activity   Alcohol use: Yes    Alcohol/week: 7.0 standard drinks    Types: 7 Glasses of wine per week    Comment: 1 glass of wine a night   Drug use: No   Sexual activity: Yes  Other Topics Concern   Not on file  Social History Narrative   Lives at home with husband.   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    G3 P2   Neg ets    Social Determinants of Health   Financial Resource Strain: Low Risk    Difficulty of Paying Living  Expenses: Not hard at all  Food Insecurity: No Food Insecurity   Worried About Charity fundraiser in the Last Year: Never true   Arboriculturist in the Last Year: Never true  Transportation Needs: No Transportation Needs   Lack of Transportation (Medical): No   Lack of Transportation (Non-Medical): No  Physical Activity: Sufficiently Active   Days of Exercise per Week: 5 days   Minutes of Exercise per Session: 30 min  Stress: No Stress Concern Present   Feeling of Stress : Not at all  Social Connections: Socially Integrated   Frequency of Communication with Friends and Family: More than three times a week   Frequency of Social Gatherings with Friends and Family: More than three times a week   Attends Religious Services: More than 4 times per year   Active Member of [employer] or Organizations: Yes   Attends Archivist Meetings:  More than 4 times per year   Marital Status: Married    Outpatient Medications Prior to Visit  Medication Sig Dispense Refill   Ascorbic Acid (VITAMIN C) 100 MG tablet Take 100 mg by mouth daily.     Cetirizine HCl 10 MG CAPS Take 10 mg by mouth as needed. Reported on 03/23/2015     cholecalciferol (VITAMIN D) 1000 UNITS tablet Take 2,000 Units by mouth daily.     diphenhydrAMINE (BENADRYL) 25 MG tablet Take 25 mg by mouth every 6 (six) hours as needed.     Evolocumab (REPATHA SURECLICK) 948 MG/ML SOAJ Inject 140 mg into the skin every 14 (fourteen) days. 2 mL 11   Ferrous Gluconate-C-Folic Acid (IRON-C PO) Take 26 mg by mouth daily.     fluticasone (FLONASE) 50 MCG/ACT nasal spray Place 2 sprays into both nostrils daily.     miconazole (MICOTIN) 2 % cream Apply 1 application topically 2 (two) times daily. 28.35 g 0   pantoprazole (PROTONIX) 40 MG tablet TAKE 1 TABLET BY MOUTH EVERY DAY *NEED APPT FOR FURTHER REFILLS* 90 tablet 1   VITAMIN E PO Take by mouth.     lisinopril (ZESTRIL) 40 MG tablet Take 1 tablet (40 mg total) by mouth daily. 90 tablet 3    No facility-administered medications prior to visit.     EXAM:  BP 110/80 (BP Location: Left Arm, Patient Position: Sitting, Cuff Size: Normal)   Pulse 91   Temp 98.5 F (36.9 C) (Oral)   Ht '5\' 2"'$  (1.575 m)   Wt 170 lb 9.6 oz (77.4 kg)   SpO2 100%   BMI 31.20 kg/m   Body mass index is 31.2 kg/m.  GENERAL: vitals reviewed and listed above, alert, oriented, appears well hydrated and in no acute distress ocass  cough  HEENT: atraumatic, conjunctiva  clear, no obvious abnormalities on inspection of external nose and ears OP : no lesion edema or exudate  1+ red  NECK: no obvious masses on inspection palpation  mild tenderness at jdg area   LUNGS: clear to auscultation bilaterally, no wheezes, rales or rhonchi, good air movement CV: HRRR, no clubbing cyanosis or  peripheral edema nl cap refill  MS: moves all extremities without noticeable focal  abnormality PSYCH: pleasant and cooperative, no obvious depression or anxiety Lab Results  Component Value Date   WBC 6.3 04/12/2020   HGB 12.8 04/12/2020   HCT 38.4 04/12/2020   PLT 325.0 04/12/2020   GLUCOSE 100 (H) 04/12/2020   CHOL 139 02/02/2020   TRIG 124 02/02/2020   HDL 66 02/02/2020   LDLDIRECT 149.9 03/03/2013   LDLCALC 51 02/02/2020   ALT 18 04/12/2020   AST 17 04/12/2020   NA 139 04/12/2020   K 4.5 04/12/2020   CL 102 04/12/2020   CREATININE 0.58 04/12/2020   BUN 11 04/12/2020   CO2 29 04/12/2020   TSH 1.78 04/12/2020   HGBA1C 5.6 04/12/2020   BP Readings from Last 3 Encounters:  07/11/21 110/80  04/05/21 122/82  10/03/20 140/77   RS test results are faintly positive confirmed but 2 viewers  control  nl.  Covid  19 and flu negative  ASSESSMENT AND PLAN:  Discussed the following assessment and plan:  Acute respiratory infection - Plan: POC COVID-19, POC Influenza A/B, POC Rapid Strep A  Group beta Strep positive - Plan: POC COVID-19, POC Influenza A/B, POC Rapid Strep A Empiric rx   avoiding amox at this  time  see notes)  -Patient  advised to return or notify health care team  if  new concerns arise.  Patient Instructions  Throat rapid  strep test is positive  ; although usually not causing that much coughing  .  Will treat for strep.    Continue fluids  mucinex dm ok .  No signs of pneumonia today .   Covid flu tests are negative  Expect improvement in the next 7-10 days  .    Standley Brooking. Kjersten Ormiston M.D.

## 2021-08-16 ENCOUNTER — Other Ambulatory Visit: Payer: Self-pay | Admitting: Internal Medicine

## 2021-08-16 DIAGNOSIS — Z1231 Encounter for screening mammogram for malignant neoplasm of breast: Secondary | ICD-10-CM

## 2021-08-25 ENCOUNTER — Ambulatory Visit
Admission: RE | Admit: 2021-08-25 | Discharge: 2021-08-25 | Disposition: A | Discharge status: Home / Self Care | Payer: BC Managed Care – PPO | Source: Ambulatory Visit | Attending: Internal Medicine | Admitting: Internal Medicine

## 2021-08-25 DIAGNOSIS — Z1231 Encounter for screening mammogram for malignant neoplasm of breast: Secondary | ICD-10-CM

## 2021-09-04 ENCOUNTER — Encounter: Payer: Self-pay | Admitting: Internal Medicine

## 2021-09-05 NOTE — Telephone Encounter (Signed)
Yes may need referral   Can she  do a virtual visit on Thursday to discuss?

## 2021-09-12 ENCOUNTER — Encounter: Payer: Self-pay | Admitting: Internal Medicine

## 2021-09-12 ENCOUNTER — Encounter: Payer: Self-pay | Admitting: Cardiology

## 2021-09-12 ENCOUNTER — Telehealth: Payer: BC Managed Care – PPO | Admitting: Internal Medicine

## 2021-09-12 VITALS — BP 123/87

## 2021-09-12 DIAGNOSIS — I10 Essential (primary) hypertension: Secondary | ICD-10-CM | POA: Diagnosis not present

## 2021-09-12 DIAGNOSIS — Z79899 Other long term (current) drug therapy: Secondary | ICD-10-CM | POA: Diagnosis not present

## 2021-09-12 DIAGNOSIS — R053 Chronic cough: Secondary | ICD-10-CM

## 2021-09-12 DIAGNOSIS — R600 Localized edema: Secondary | ICD-10-CM

## 2021-09-12 NOTE — Progress Notes (Signed)
Virtual Visit via Video Note  I connected with Jacqueline Beasley on 09/12/21 at 11:45 AM EDT by a video enabled telemedicine application and verified that I am speaking with the correct person using two identifiers. Location patient: home/office Location provider:work office Persons participating in the virtual visit: patient, provider  Patient aware  of the limitations of evaluation and management by telemedicine and  availability of in person appointments. and agreed to proceed.   HPI: Jacqueline Beasley presents for video visit because of requesting allergy referral for a couple of reasons. Covered from respiratory infection with positive strep test treated with Ceftin end of May and got better Then recently has had some Kliofem cough with no shortness of breath not serious and no associated fever.  Had episode July 10 after having some shellfish that night woke up with her face puffy and the next morning her eyes were swollen shut with some itching but no hives no respiratory GI symptoms.  With multiple antihistamines and time it took 48 hours to resolve.  This alarmed her and she request perhaps an allergy evaluation. She is uncertain if she is allergic to shellfish or not.  Also of note she is on lisinopril has been on this a while recent increase in dosing never had a problem with cough or hives.  Blood pressure at home today is 123/87 multiple confirmed.  ROS: See pertinent positives and negatives per HPI.  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    AUGMENTATION MAMMAPLASTY     BREAST ENHANCEMENT SURGERY  2010   CESAREAN SECTION  [id number]    Family History   Problem Relation Age of Onset   Cancer Mother        Uterus   Stroke Mother        died 55    Hypertension Mother    Hypertension Sister    Diabetes Maternal Grandmother    Colon cancer Neg Hx    Breast cancer Neg Hx     Social History   Tobacco Use   Smoking status: Never   Smokeless tobacco: Never  Vaping Use   Vaping Use: Never used  Substance Use Topics   Alcohol use: Yes    Alcohol/week: 7.0 standard drinks of alcohol    Types: 7 Glasses of wine per week    Comment: 1 glass of wine a night   Drug use: No      Current Outpatient Medications:    Ascorbic Acid (VITAMIN C) 100 MG tablet, Take 100 mg by mouth daily., Disp: , Rfl:    benzonatate (TESSALON PERLES) 100 MG capsule, Take 1 capsule (100 mg total) by mouth 3 (three) times daily as needed for cough., Disp: 21 capsule, Rfl: 0   Cetirizine HCl 10 MG CAPS, Take 10 mg by mouth as needed. Reported on 03/23/2015, Disp: , Rfl:    cholecalciferol (VITAMIN D) 1000 UNITS tablet, Take 2,000 Units by mouth daily., Disp: , Rfl:    diphenhydrAMINE (BENADRYL) 25 MG tablet, Take 25 mg by mouth every 6 (  six) hours as needed., Disp: , Rfl:    Evolocumab (REPATHA SURECLICK) 166 MG/ML SOAJ, Inject 140 mg into the skin every 14 (fourteen) days., Disp: 2 mL, Rfl: 11   Ferrous Gluconate-C-Folic Acid (IRON-C PO), Take 26 mg by mouth daily., Disp: , Rfl:    fluticasone (FLONASE) 50 MCG/ACT nasal spray, Place 2 sprays into both nostrils daily., Disp: , Rfl:    miconazole (MICOTIN) 2 % cream, Apply 1 application topically 2 (two) times daily., Disp: 28.35 g, Rfl: 0   pantoprazole (PROTONIX) 40 MG tablet, TAKE 1 TABLET BY MOUTH EVERY DAY *NEED APPT FOR FURTHER REFILLS*, Disp: 90 tablet, Rfl: 1   VITAMIN E PO, Take by mouth., Disp: , Rfl:    lisinopril (ZESTRIL) 40 MG tablet, Take 1 tablet (40 mg total) by mouth daily., Disp: 90 tablet, Rfl: 3  EXAM: BP Readings from Last 3 Encounters:  09/12/21 123/87  07/11/21 110/80  04/05/21 122/82     VITALS per patient if applicable:  GENERAL: alert, oriented, appears well and in no acute distress  HEENT: atraumatic, conjunttiva clear, no obvious abnormalities on inspection of external nose and ears  NECK: normal movements of the head and neck  LUNGS: on inspection no signs of respiratory distress, breathing rate appears normal, no obvious gross SOB, gasping or wheezing  CV: no obvious cyanosis  MS: moves all visible extremities without noticeable abnormality  PSYCH/NEURO: pleasant and cooperative, no obvious depression or anxiety, speech and thought processing grossly intact Lab Results  Component Value Date   WBC 6.3 04/12/2020   HGB 12.8 04/12/2020   HCT 38.4 04/12/2020   PLT 325.0 04/12/2020   GLUCOSE 100 (H) 04/12/2020   CHOL 139 02/02/2020   TRIG 124 02/02/2020   HDL 66 02/02/2020   LDLDIRECT 149.9 03/03/2013   LDLCALC 51 02/02/2020   ALT 18 04/12/2020   AST 17 04/12/2020   NA 139 04/12/2020   K 4.5 04/12/2020   CL 102 04/12/2020   CREATININE 0.58 04/12/2020   BUN 11 04/12/2020   CO2 29 04/12/2020   TSH 1.78 04/12/2020   HGBA1C 5.6 04/12/2020    ASSESSMENT AND PLAN:  Discussed the following assessment and plan:    ICD-10-CM   1. Cough, persistent  R05.3 Ambulatory referral to Allergy   post resp infection  on aceI    2. Facial edema  history of  episode  R60.0 Ambulatory referral to Allergy   ? shellfish ? acei contributor?     3. Medication management  Z79.899     4. Essential hypertension  I10      Prolonged cough after respiratory infection with no active infection obvious. History of episode of what sounds like facial edema angioedema without other allergic symptoms.  Question from shellfish possible related to ACE inhibitor concerns.  Discussed differential diagnosis agree with allergy referral for help We will send a message to Dr. Percival Spanish for substitution for hypertension for now would like her to go off of the lisinopril in regard to  the prolonged cough and the question of angioedema facial swelling which could have been from other causes but that is to be determined.   Counseled.   Expectant management and discussion of plan and treatment with opportunity to ask questions and all were answered. The patient agreed with the plan and demonstrated an understanding of the instructions.   Advised to call back or seek an in-person evaluation if worsening  or having  further concerns  in interim. Return for if recurs  and when due .    Shanon Ace, MD

## 2021-09-13 ENCOUNTER — Other Ambulatory Visit: Payer: Self-pay | Admitting: Pharmacist Clinician (PhC)/ Clinical Pharmacy Specialist

## 2021-09-13 MED ORDER — OLMESARTAN MEDOXOMIL 40 MG PO TABS: 40.0000 mg | ORAL_TABLET | Freq: Every day | ORAL | 6 refills | Status: DC

## 2021-11-11 NOTE — Progress Notes (Unsigned)
Cardiology Office Note   Date:  11/11/2021   ID:  Fynlee, Rowlands 12-20-1960, MRN [id number]  PCP:  Burnis Medin, MD  Cardiologist:   Minus Breeding, MD    No chief complaint on file.      History of Present Illness: Jacqueline Beasley is a 61 y.o. female who presents for evaluation of chest pain.  At the first appt in 2019 I performed a POET (Plain Old Exercise Treadmill).  She exercised for 11 minutes without any ischemic ST T wave changes.  She had a negative POET (Plain Old Exercise Treadmill) last month.   She had an excellent exercise tolerance.  Of note she did not tolerate statin.  She is now on bempedoic acid plus Zetia.  She had a negative POET (Plain Old Exercise Treadmill) in July of this year.   Since I saw her ***   ***  she has done well.  She did a treadmill in July and she was able to go 9 minutes and 46 seconds.  She had a bit of a hypertensive response but there were no other abnormalities. The patient denies any new symptoms such as chest discomfort, neck or arm discomfort. There has been no new shortness of breath, PND or orthopnea. There have been no reported palpitations, presyncope or syncope.   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    AUGMENTATION MAMMAPLASTY     BREAST ENHANCEMENT SURGERY  2010   CESAREAN SECTION  [id number]     Current Outpatient Medications  Medication Sig Dispense Refill   Ascorbic Acid (VITAMIN C) 100 MG tablet Take 100 mg by mouth daily.     benzonatate (TESSALON PERLES) 100 MG capsule Take 1 capsule (100 mg total) by mouth 3 (three) times daily as needed for cough. 21 capsule 0   Cetirizine HCl 10 MG  CAPS Take 10 mg by mouth as needed. Reported on 03/23/2015     cholecalciferol (VITAMIN D) 1000 UNITS tablet Take 2,000 Units by mouth daily.     diphenhydrAMINE (BENADRYL) 25 MG tablet Take 25 mg by mouth every 6 (six) hours as needed.     Evolocumab (REPATHA SURECLICK) 703 MG/ML SOAJ Inject 140 mg into the skin every 14 (fourteen) days. 2 mL 11   Ferrous Gluconate-C-Folic Acid (IRON-C PO) Take 26 mg by mouth daily.     fluticasone (FLONASE) 50 MCG/ACT nasal spray Place 2 sprays into both nostrils daily.     miconazole (MICOTIN) 2 % cream Apply 1 application topically 2 (two) times daily. 28.35 g 0   olmesartan (BENICAR) 40 MG tablet Take 1 tablet (40 mg total) by mouth daily. 30 tablet 6   pantoprazole (PROTONIX) 40 MG tablet TAKE 1 TABLET BY MOUTH EVERY DAY *NEED APPT FOR FURTHER REFILLS* 90 tablet 1   VITAMIN E PO Take by mouth.     No current facility-administered medications for this visit.    Allergies:   Ambien [zolpidem] and Amoxicillin    ROS:  Please see the history of present  illness.   Otherwise, review of systems are positive for ***.   All other systems are reviewed and negative.    PHYSICAL EXAM: VS:  There were no vitals taken for this visit. , BMI There is no height or weight on file to calculate BMI.  GENERAL:  Well appearing NECK:  No jugular venous distention, waveform within normal limits, carotid upstroke brisk and symmetric, no bruits, no thyromegaly LUNGS:  Clear to auscultation bilaterally CHEST:  Unremarkable HEART:  PMI not displaced or sustained,S1 and S2 within normal limits, no S3, no S4, no clicks, no rubs, no murmurs ABD:  Flat, positive bowel sounds normal in frequency in pitch, no bruits, no rebound, no guarding, no midline pulsatile mass, no hepatomegaly, no splenomegaly EXT:  2 plus pulses throughout, no edema, no cyanosis no clubbing   EKG:  EKG is *** ordered today. Sinus rhythm, rate ***, axis within normal limits, intervals within normal limits,  no acute ST-T wave changes.   Recent Labs: No results found for requested labs within last 365 days.    Lipid Panel    Component Value Date/Time   CHOL 139 02/02/2020 1026   TRIG 124 02/02/2020 1026   HDL 66 02/02/2020 1026   CHOLHDL 3.4 09/28/2019 1027   CHOLHDL 3 08/13/2018 1106   VLDL 20.4 08/13/2018 1106   LDLCALC 51 02/02/2020 1026   LDLDIRECT 149.9 03/03/2013 1106      Wt Readings from Last 3 Encounters:  07/11/21 170 lb 9.6 oz (77.4 kg)  04/10/21 174 lb (78.9 kg)  04/05/21 174 lb 9.6 oz (79.2 kg)      Other studies Reviewed: Additional studies/ records that were reviewed today include: ***  Review of the above records demonstrates:    See elsewhere   ASSESSMENT AND PLAN:  ELEVATED CORONARY CALCIUM:    She had a negative POET (Plain Old Exercise Treadmill).  ***  She will continue with primary risk reduction  DYSLIPIDEMIA:   ***   She has not tolerated statin and was put on Nexlizet.  She is now on Repatha and her LDL was 51 with an HDL of 66.  No change in therapy.   HTN:  The blood pressure is ***  mildly elevated and she is going to keep a blood pressure diary.  No change in therapy.   Current medicines are reviewed at length with the patient today.  The patient does not have concerns regarding medicines.  The following changes have been made:   ***  Labs/ tests ordered today include:  ***   No orders of the defined types were placed in this encounter.    Disposition:   FU with me *** year   Signed, Minus Breeding, MD  11/11/2021 12:45 PM    [facility]

## 2021-11-13 ENCOUNTER — Encounter: Payer: Self-pay | Admitting: Cardiology

## 2021-11-13 ENCOUNTER — Ambulatory Visit: Payer: BC Managed Care – PPO | Attending: Cardiology | Admitting: Cardiology

## 2021-11-13 VITALS — BP 124/84 | HR 78 | Ht 62.5 in | Wt 162.6 lb

## 2021-11-13 DIAGNOSIS — I10 Essential (primary) hypertension: Secondary | ICD-10-CM

## 2021-11-13 DIAGNOSIS — R931 Abnormal findings on diagnostic imaging of heart and coronary circulation: Secondary | ICD-10-CM | POA: Diagnosis not present

## 2021-11-13 DIAGNOSIS — E785 Hyperlipidemia, unspecified: Secondary | ICD-10-CM

## 2021-11-13 NOTE — Patient Instructions (Signed)
  Lab Work:  Your physician recommends that you return for lab work FASTING  If you have labs (blood work) drawn today and your tests are completely normal, you will receive your results only by: [place] (if you have [place]) OR A paper copy in the mail If you have any lab test that is abnormal or we need to change your treatment, we will call you to review the results.   Follow-Up: At [facility], you and your health needs are our priority.  As part of our continuing mission to provide you with exceptional heart care, we have created designated Provider Care Teams.  These Care Teams include your primary Cardiologist (physician) and Advanced Practice Providers (APPs -  Physician Assistants and Nurse Practitioners) who all work together to provide you with the care you need, when you need it.  We recommend signing up for the patient portal called "MyChart".  Sign up information is provided on this After Visit Summary.  MyChart is used to connect with patients for Virtual Visits (Telemedicine).  Patients are able to view lab/test results, encounter notes, upcoming appointments, etc.  Non-urgent messages can be sent to your provider as well.   To learn more about what you can do with MyChart, go to [email or web address].    Your next appointment:   12 month(s)  The format for your next appointment:   In Person  Provider:   Minus Breeding MD

## 2021-11-14 NOTE — Progress Notes (Unsigned)
New Patient Note  RE: ADRIONNA Beasley MRN: [id number] DOB: August 07, 1960 Date of Office Visit: 11/15/2021  Consult requested by: Burnis Medin, MD Primary care provider: Burnis Medin, MD  Chief Complaint: No chief complaint on file.  History of Present Illness: I had the pleasure of seeing Jacqueline Beasley for initial evaluation at the [facility] [place] [facility] [place] [facility] on 11/14/2021. She is a 61 y.o. female, who is referred here by Panosh, Standley Brooking, MD for the evaluation of cough.  09/12/2021 PCP visit: "Jacqueline Beasley presents for video visit because of requesting allergy referral for a couple of reasons. Covered from respiratory infection with positive strep test treated with Ceftin end of May and got better Then recently has had some Kliofem cough with no shortness of breath not serious and no associated fever.  Had episode July 10 after having some shellfish that night woke up with her face puffy and the next morning her eyes were swollen shut with some itching but no hives no respiratory GI symptoms.  With multiple antihistamines and time it took 48 hours to resolve.  This alarmed her and she request perhaps an allergy evaluation. She is uncertain if she is allergic to shellfish or not.  Also of note she is on lisinopril has been on this a while recent increase in dosing never had a problem with cough or hives.  Prolonged cough after respiratory infection with no active infection obvious. History of episode of what sounds like facial edema angioedema without other allergic symptoms.  Question from shellfish possible related to ACE inhibitor concerns.  Discussed differential diagnosis agree with allergy referral for help We will send a message to Dr. Percival Spanish for substitution for hypertension for now would like her to go off of the lisinopril in regard to the prolonged cough and the question of angioedema facial swelling which could have been from other causes but that is to be  determined."  Assessment and Plan: [name] is a 61 y.o. female with: No problem-specific Assessment & Plan notes found for this encounter.  No follow-ups on file.  No orders of the defined types were placed in this encounter.  Lab Orders  No laboratory test(s) ordered today    Other allergy screening: Asthma: {Blank single:19197::"yes","no"} Rhino conjunctivitis: {Blank single:19197::"yes","no"} Food allergy: {Blank single:19197::"yes","no"} Medication allergy: {Blank single:19197::"yes","no"} Hymenoptera allergy: {Blank single:19197::"yes","no"} Urticaria: {Blank single:19197::"yes","no"} Eczema:{Blank single:19197::"yes","no"} History of recurrent infections suggestive of immunodeficency: {Blank single:19197::"yes","no"}  Diagnostics: Spirometry:  Tracings reviewed. Her effort: {Blank single:19197::"Good reproducible efforts.","It was hard to get consistent efforts and there is a question as to whether this reflects a maximal maneuver.","Poor effort, data can not be interpreted."} FVC: ***L FEV1: ***L, ***% predicted FEV1/FVC ratio: ***% Interpretation: {Blank single:19197::"Spirometry consistent with mild obstructive disease","Spirometry consistent with moderate obstructive disease","Spirometry consistent with severe obstructive disease","Spirometry consistent with possible restrictive disease","Spirometry consistent with mixed obstructive and restrictive disease","Spirometry uninterpretable due to technique","Spirometry consistent with normal pattern","No overt abnormalities noted given today's efforts"}.  Please see scanned spirometry results for details.  Skin Testing: {Blank single:19197::"Select foods","Environmental allergy panel","Environmental allergy panel and select foods","Food allergy panel","None","Deferred due to recent antihistamines use"}. *** Results discussed with patient/family.   Past Medical History: Patient Active Problem List   Diagnosis Date Noted  .  Elevated coronary artery calcium score 09/26/2019  . Educated about COVID-19 virus infection 09/26/2019  . Diverticulosis of colon without hemorrhage 10/22/2018  . Influenza vaccination declined by patient 01/02/2014  . Postmenopausal HRT (hormone replacement therapy) 01/02/2014  .  Hyperglycemia 03/03/2013  . Hyperlipidemia 03/03/2013  . Fasting hyperglycemia 12/30/2012  . Family history of diabetes mellitus 12/30/2012  . Other and unspecified hyperlipidemia 12/30/2012  . Essential hypertension 12/30/2012  . H/O hysterectomy for benign disease 02/04/2012  . Surgical menopause  2007 02/04/2012   Past Medical History:  Diagnosis Date  . Allergy    seasonal  . 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 Back pain    Recurrent has seen neurosurgery in past  . History of anal fissures    pt has itching with this at times  . Hx of varicella   . Hypertension    Past Surgical History: Past Surgical History:  Procedure Laterality Date  . ABDOMINAL HYSTERECTOMY     total with bso  for fibroids.   . AUGMENTATION MAMMAPLASTY    . BREAST ENHANCEMENT SURGERY  2010  . CESAREAN SECTION  [id number]   Medication List:  Current Outpatient Medications  Medication Sig Dispense Refill  . Ascorbic Acid (VITAMIN C) 100 MG tablet Take 100 mg by mouth daily.    . Cetirizine HCl 10 MG CAPS Take 10 mg by mouth as needed. Reported on 03/23/2015    . cholecalciferol (VITAMIN D) 1000 UNITS tablet Take 2,000 Units by mouth daily.    . diphenhydrAMINE (BENADRYL) 25 MG tablet Take 25 mg by mouth every 6 (six) hours as needed.    . Evolocumab (REPATHA SURECLICK) 086 MG/ML SOAJ Inject 140 mg into the skin every 14 (fourteen) days. 2 mL 11  . Ferrous Gluconate-C-Folic Acid (IRON-C PO) Take 26 mg by mouth daily.    . fluticasone (FLONASE) 50 MCG/ACT nasal spray Place 2 sprays into both nostrils daily.    Marland Kitchen olmesartan (BENICAR) 40 MG tablet Take 1 tablet (40 mg total) by mouth daily. 30 tablet 6  . pantoprazole (PROTONIX) 40  MG tablet TAKE 1 TABLET BY MOUTH EVERY DAY *NEED APPT FOR FURTHER REFILLS* 90 tablet 1  . VITAMIN E PO Take by mouth.     No current facility-administered medications for this visit.   Allergies: Allergies  Allergen Reactions  . Ambien [Zolpidem] Other (See Comments)    Amnesia   . Amoxicillin Rash    See note Feb 27 23  poss amoxicillin rash . No hives    Social History: Social History   Socioeconomic History  . Marital status: Married    Spouse name: Not on file  . Number of children: 2  . Years of education: Not on file  . Highest education level: Some college, no degree  Occupational History  . Not on file  Tobacco Use  . Smoking status: Never  . Smokeless tobacco: Never  Vaping Use  . Vaping Use: Never used  Substance and Sexual Activity  . Alcohol use: Yes    Alcohol/week: 7.0 standard drinks of alcohol    Types: 7 Glasses of wine per week    Comment: 1 glass of wine a night  . Drug use: No  . Sexual activity: Yes  Other Topics Concern  . Not on file  Social History Narrative   Lives at home with husband.   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 Difficulty of Paying Living Expenses: Not hard at all  Food Insecurity: No Food Insecurity (04/04/2021)   Hunger Vital Sign   . Worried About Charity fundraiser in the Last Year: Never true   . Ran Out of Food in the Last Year: Never true  Transportation Needs: No Transportation Needs (04/04/2021)   PRAPARE - Transportation   . Lack of Transportation (Medical): No   . Lack of Transportation (Non-Medical): No  Physical Activity: Sufficiently Active (04/04/2021)   Exercise Vital Sign   . Days of Exercise per Week: 5 days   . Minutes of Exercise per Session: 30 min  Stress: No Stress Concern Present  (04/04/2021)   [place]   . Feeling of Stress : Not at all  Social Connections: Socially Integrated (04/04/2021)   Social Connection and Isolation Panel [NHANES]   . Frequency of Communication with Friends and Family: More than three times a week   . Frequency of Social Gatherings with Friends and Family: More than three times a week   . Attends Religious Services: More than 4 times per year   . Active Member of Clubs or Organizations: Yes   . Attends Archivist Meetings: More than 4 times per year   . Marital Status: Married   Lives in a ***. Smoking: *** Occupation: ***  Environmental HistoryFreight forwarder in the house: Estate agent in the family room: {Blank single:19197::"yes","no"} Carpet in the bedroom: {Blank single:19197::"yes","no"} Heating: {Blank single:19197::"electric","gas","heat pump"} Cooling: {Blank single:19197::"central","window","heat pump"} Pet: {Blank single:19197::"yes ***","no"}  Family History: Family History  Problem Relation Age of Onset  . Cancer Mother        Uterus  . Stroke Mother        died 27   . Hypertension Mother   . Hypertension Sister   . Diabetes Maternal Grandmother   . Colon cancer Neg Hx   . Breast cancer Neg Hx    Problem                               Relation Asthma                                   *** Eczema                                *** Food allergy                          *** Allergic rhino conjunctivitis     ***  Review of Systems  Constitutional:  Negative for appetite change, chills, fever and unexpected weight change.  HENT:  Negative for congestion and rhinorrhea.   Eyes:  Negative for itching.  Respiratory:  Negative for cough, chest tightness, shortness of breath and wheezing.   Cardiovascular:  Negative for chest pain.  Gastrointestinal:  Negative for abdominal pain.  Genitourinary:  Negative for  difficulty urinating.  Skin:  Negative for rash.  Neurological:  Negative for headaches.   Objective: There were no vitals taken for this visit. There is no height or weight on file to calculate BMI. Physical Exam Vitals and nursing note reviewed.  Constitutional:      Appearance: Normal appearance. She is well-developed.  HENT:     Head: Normocephalic and atraumatic.  Right Ear: Tympanic membrane and external ear normal.     Left Ear: Tympanic membrane and external ear normal.     Nose: Nose normal.     Mouth/Throat:     Mouth: Mucous membranes are moist.     Pharynx: Oropharynx is clear.  Eyes:     Conjunctiva/sclera: Conjunctivae normal.  Cardiovascular:     Rate and Rhythm: Normal rate and regular rhythm.     Heart sounds: Normal heart sounds. No murmur heard.    No friction rub. No gallop.  Pulmonary:     Effort: Pulmonary effort is normal.     Breath sounds: Normal breath sounds. No wheezing, rhonchi or rales.  Musculoskeletal:     Cervical back: Neck supple.  Skin:    General: Skin is warm.     Findings: No rash.  Neurological:     Mental Status: She is alert and oriented to person, place, and time.  Psychiatric:        Behavior: Behavior normal.  The plan was reviewed with the patient/family, and all questions/concerned were addressed.  It was my pleasure to see Jacqueline Beasley today and participate in her care. Please feel free to contact me with any questions or concerns.  Sincerely,  Rexene Alberts, DO Allergy & Immunology  [facility] office: [place] office: [phone]

## 2021-11-15 ENCOUNTER — Encounter: Payer: Self-pay | Admitting: Allergy

## 2021-11-15 ENCOUNTER — Ambulatory Visit: Payer: BC Managed Care – PPO | Admitting: Allergy

## 2021-11-15 VITALS — BP 144/84 | HR 80 | Temp 97.4°F | Resp 18 | Ht 62.5 in | Wt 164.6 lb

## 2021-11-15 DIAGNOSIS — J3089 Other allergic rhinitis: Secondary | ICD-10-CM | POA: Diagnosis not present

## 2021-11-15 DIAGNOSIS — Z889 Allergy status to unspecified drugs, medicaments and biological substances status: Secondary | ICD-10-CM | POA: Diagnosis not present

## 2021-11-15 DIAGNOSIS — T783XXA Angioneurotic edema, initial encounter: Secondary | ICD-10-CM | POA: Diagnosis not present

## 2021-11-15 DIAGNOSIS — T783XXD Angioneurotic edema, subsequent encounter: Secondary | ICD-10-CM

## 2021-11-15 DIAGNOSIS — T781XXD Other adverse food reactions, not elsewhere classified, subsequent encounter: Secondary | ICD-10-CM

## 2021-11-15 NOTE — Assessment & Plan Note (Addendum)
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 Takes zyrtec and Flonase with good benefit. 2 dogs at home. No prior work up.  Today's skin testing showed: Positive to weed, trees, mold.  Start environmental control measures as below.  Use over the counter antihistamines such as Zyrtec (cetirizine), Claritin (loratadine), Allegra (fexofenadine), or Xyzal (levocetirizine) daily as needed. May take twice a day during allergy flares. May switch antihistamines every few months.  Use Flonase (fluticasone) nasal spray 1 spray per nostril twice a day as needed for nasal congestion.   Nasal saline spray (i.e., Simply Saline) or nasal saline lavage (i.e., NeilMed) is recommended as needed and prior to medicated nasal sprays.  Consider allergy injections for long term control if above medications do not help the symptoms - handout given.

## 2021-11-15 NOTE — Patient Instructions (Addendum)
Today's skin testing showed: Positive to weed, trees, mold. Negative to shellfish and mollusks.   Results given.  Food: Start strict avoidance of shellfish and mollusks and [place]. Food allergen skin testing has excellent negative predictive value however there is still a small chance that the allergy exists. Therefore, we will investigate further with serum specific IgE levels and, if negative then schedule for open graded oral food challenge. Get bloodwork We are ordering labs, so please allow 1-2 weeks for the results to come back. With the newly implemented Cures Act, the labs might be visible to you at the same time that they become visible to me. However, I will not address the results until all of the results are back, so please be patient.  In the meantime, continue recommendations in your patient instructions, including avoidance measures (if applicable), until you hear from me. For mild symptoms you can take over the counter antihistamines such as Benadryl and monitor symptoms closely. If symptoms worsen or if you have severe symptoms including breathing issues, throat closure, significant swelling, whole body hives, severe diarrhea and vomiting, lightheadedness then seek immediate medical care. Action plan given - no need for Epipen at this time as I'm not sure if the swelling was due to the food or the lisinopril.   Swelling If you have another swelling episode then let us know.   Environmental allergies Start environmental control measures as below. Use over the counter antihistamines such as Zyrtec (cetirizine), Claritin (loratadine), Allegra (fexofenadine), or Xyzal (levocetirizine) daily as needed. May take twice a day during allergy flares. May switch antihistamines every few months. Use Flonase (fluticasone) nasal spray 1 spray per nostril twice a day as needed for nasal congestion.  Nasal saline spray (i.e., Simply Saline) or nasal saline lavage (i.e., NeilMed) is  recommended as needed and prior to medicated nasal sprays. Consider allergy injections for long term control if above medications do not help the symptoms - handout given.   Drug allergies Continue to avoid lisinopril and amoxicillin.  Follow up for food challenge depending on bloodwork results.   Reducing Pollen Exposure Pollen seasons: trees (spring), grass (summer) and ragweed/weeds (fall). Keep windows closed in your home and car to lower pollen exposure.  Install air conditioning in the bedroom and throughout the house if possible.  Avoid going out in dry windy days - especially early morning. Pollen counts are highest between 5 - 10 AM and on dry, hot and windy days.  Save outside activities for late afternoon or after a heavy rain, when pollen levels are lower.  Avoid mowing of grass if you have grass pollen allergy. Be aware that pollen can also be transported indoors on people and pets.  Dry your clothes in an automatic dryer rather than hanging them outside where they might collect pollen.  Rinse hair and eyes before bedtime.  Mold Control Mold and fungi can grow on a variety of surfaces provided certain temperature and moisture conditions exist.  Outdoor molds grow on plants, decaying vegetation and soil. The major outdoor mold, Alternaria and Cladosporium, are found in very high numbers during hot and dry conditions. Generally, a late summer - fall peak is seen for common outdoor fungal spores. Rain will temporarily lower outdoor mold spore count, but counts rise rapidly when the rainy period ends. The most important indoor molds are Aspergillus and Penicillium. Dark, humid and poorly ventilated basements are ideal sites for mold growth. The next most common sites of mold growth are the bathroom and  the kitchen. Outdoor (Seasonal) Mold Control Use air conditioning and keep windows closed. Avoid exposure to decaying vegetation. Avoid leaf raking. Avoid grain handling. Consider  wearing a face mask if working in moldy areas.  Indoor (Perennial) Mold Control  Maintain humidity below 50%. Get rid of mold growth on hard surfaces with water, detergent and, if necessary, 5% bleach (do not mix with other cleaners). Then dry the area completely. If mold covers an area more than 10 square feet, consider hiring an indoor environmental professional. For clothing, washing with soap and water is best. If moldy items cannot be cleaned and dried, throw them away. Remove sources e.g. contaminated carpets. Repair and seal leaking roofs or pipes. Using dehumidifiers in damp basements may be helpful, but empty the water and clean units regularly to prevent mildew from forming. All rooms, especially basements, bathrooms and kitchens, require ventilation and cleaning to deter mold and mildew growth. Avoid carpeting on concrete or damp floors, and storing items in damp areas.

## 2021-11-15 NOTE — Assessment & Plan Note (Signed)
Lisinopril caused coughing and possible edema. Amoxicillin caused rash.  Continue to avoid lisinopril and amoxicillin.

## 2021-11-15 NOTE — Assessment & Plan Note (Signed)
Facial swelling after eating scallops and noted occasional puffy eyes with shrimp and Chardonnay as well. Patient was also on lisinopril during these times - this was stopped. Symptoms resolved after 1 week with benadryl. No issues with finned fish or other foods.   Today's skin testing showed: Negative to shellfish and mollusks.   Start strict avoidance of shellfish, mollusks and Chardonnay.  Food allergen skin testing has excellent negative predictive value however there is still a small chance that the allergy exists. Therefore, we will investigate further with serum specific IgE levels and, if negative then schedule for open graded oral food challenge.  Get bloodwork.   For mild symptoms you can take over the counter antihistamines such as Benadryl and monitor symptoms closely. If symptoms worsen or if you have severe symptoms including breathing issues, throat closure, significant swelling, whole body hives, severe diarrhea and vomiting, lightheadedness then seek immediate medical care.  Action plan given - no need for Epipen at this time as I'm not sure if the swelling was due to the food or the lisinopril.  . If you have another swelling episode then let us know - may need to do full angioedema work up then.

## 2021-11-17 LAB — ALLERGEN PROFILE, SHELLFISH
Clam IgE: <0.1 kU/L
F023-IgE Crab: <0.1 kU/L
F080-IgE Lobster: <0.1 kU/L
F290-IgE Oyster: <0.1 kU/L
Scallop IgE: <0.1 kU/L
Shrimp IgE: <0.1 kU/L

## 2021-11-18 LAB — LIPID PANEL
Chol/HDL Ratio: 2.3 ratio (ref 0.0–4.4)
Cholesterol, Total: 152 mg/dL (ref 100–199)
HDL: 67 mg/dL (ref >39)
LDL Chol Calc (NIH): 70 mg/dL (ref 0–99)
Triglycerides: 78 mg/dL (ref 0–149)
VLDL Cholesterol Cal: 15 mg/dL (ref 5–40)

## 2021-11-18 LAB — LIPOPROTEIN A (LPA): Lipoprotein (a): <8.4 nmol/L (ref <75.0)

## 2021-12-18 ENCOUNTER — Other Ambulatory Visit: Payer: Self-pay | Admitting: Internal Medicine

## 2022-01-12 ENCOUNTER — Telehealth: Payer: Self-pay | Admitting: Pharmacist

## 2022-01-12 NOTE — Telephone Encounter (Signed)
Repatha prior authorization submitted, Key: [id number]. Approved immediately through 01/12/23.

## 2022-03-10 ENCOUNTER — Other Ambulatory Visit: Payer: Self-pay | Admitting: Cardiology

## 2022-03-17 ENCOUNTER — Other Ambulatory Visit: Payer: Self-pay | Admitting: Internal Medicine

## 2022-06-08 ENCOUNTER — Other Ambulatory Visit: Payer: Self-pay | Admitting: Family

## 2022-06-12 ENCOUNTER — Other Ambulatory Visit: Payer: Self-pay | Admitting: Cardiology

## 2022-07-23 ENCOUNTER — Encounter: Payer: Self-pay | Admitting: Internal Medicine

## 2022-07-23 ENCOUNTER — Other Ambulatory Visit: Payer: Self-pay | Admitting: Family

## 2022-07-23 MED ORDER — PANTOPRAZOLE SODIUM 40 MG PO TBEC: DELAYED_RELEASE_TABLET | ORAL | 0 refills | Status: DC

## 2022-08-22 ENCOUNTER — Other Ambulatory Visit: Payer: Self-pay | Admitting: Internal Medicine

## 2022-08-22 DIAGNOSIS — Z1231 Encounter for screening mammogram for malignant neoplasm of breast: Secondary | ICD-10-CM

## 2022-09-05 ENCOUNTER — Ambulatory Visit
Admission: RE | Admit: 2022-09-05 | Discharge: 2022-09-05 | Disposition: A | Discharge status: Home / Self Care | Payer: BC Managed Care – PPO | Source: Ambulatory Visit | Attending: Internal Medicine | Admitting: Internal Medicine

## 2022-09-05 DIAGNOSIS — Z1231 Encounter for screening mammogram for malignant neoplasm of breast: Secondary | ICD-10-CM

## 2022-10-19 ENCOUNTER — Other Ambulatory Visit: Payer: Self-pay | Admitting: Family

## 2022-11-29 ENCOUNTER — Other Ambulatory Visit: Payer: Self-pay | Admitting: Cardiology

## 2022-12-10 NOTE — Progress Notes (Unsigned)
No chief complaint on file.   HPI: Patient  Jacqueline Beasley  62 y.o. comes in today for Preventive Health Care visit   Health Maintenance  Topic Date Due   Zoster Vaccines- Shingrix (1 of 2) Never done   INFLUENZA VACCINE  Never done   COVID-19 Vaccine (5 - 2023-24 season) 10/14/2022   MAMMOGRAM  09/04/2024   DTaP/Tdap/Td (4 - Td or Tdap) 03/22/2025   Colonoscopy  11/30/2028   Hepatitis C Screening  Completed   HIV Screening  Completed   HPV VACCINES  Aged Out   Health Maintenance Review LIFESTYLE:  Exercise:   Tobacco/ETS: Alcohol:  Sugar beverages: Sleep: Drug use: no HH of  Work:    ROS:  GEN/ HEENT: No fever, significant weight changes sweats headaches vision problems hearing changes, CV/ PULM; No chest pain shortness of breath cough, syncope,edema  change in exercise tolerance. GI /GU: No adominal pain, vomiting, change in bowel habits. No blood in the stool. No significant GU symptoms. SKIN/HEME: ,no acute skin rashes suspicious lesions or bleeding. No lymphadenopathy, nodules, masses.  NEURO/ PSYCH:  No neurologic signs such as weakness numbness. No depression anxiety. IMM/ Allergy: No unusual infections.  Allergy .   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    AUGMENTATION MAMMAPLASTY     BREAST ENHANCEMENT SURGERY  2010   CESAREAN SECTION  [id number]    Family History  Problem Relation Age of Onset   Cancer Mother        Uterus   Stroke Mother        died 25    Hypertension Mother    Hypertension Sister    Diabetes Maternal Grandmother    Colon cancer Neg Hx    Breast  cancer Neg Hx     Social History   Socioeconomic History   Marital status: Married    Spouse name: Not on file   Number of children: 2   Years of education: Not on file   Highest education level: Associate degree: occupational, Scientist, product/process development, or vocational program  Occupational History   Not on file  Tobacco Use   Smoking status: Never   Smokeless tobacco: Never  Vaping Use   Vaping status: Never Used  Substance and Sexual Activity   Alcohol use: Yes    Alcohol/week: 7.0 standard drinks of alcohol    Types: 7 Glasses of wine per week    Comment: 1 glass of wine a night   Drug use: No   Sexual activity: Yes  Other Topics Concern   Not on file  Social History Narrative   Lives at home with husband.   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    G3 P2   Neg ets    Social Determinants of Health   Financial Resource Strain: Low Risk  (12/10/2022)   Overall Financial Resource Strain (CARDIA)    Difficulty of Paying Living Expenses:  Not hard at all  Food Insecurity: No Food Insecurity (12/10/2022)   Hunger Vital Sign    Worried About Running Out of Food in the Last Year: Never true    Ran Out of Food in the Last Year: Never true  Transportation Needs: No Transportation Needs (12/10/2022)   PRAPARE - Administrator, Civil Service (Medical): No    Lack of Transportation (Non-Medical): No  Physical Activity: Insufficiently Active (12/10/2022)   Exercise Vital Sign    Days of Exercise per Week: 5 days    Minutes of Exercise per Session: 20 min  Stress: No Stress Concern Present (12/10/2022)   [employer] of Occupational Health - Occupational Stress Questionnaire    Feeling of Stress : Only a little  Social Connections: Socially Integrated (12/10/2022)   Social Connection and Isolation Panel [NHANES]    Frequency of Communication with Friends and Family: More than three times a week    Frequency  of Social Gatherings with Friends and Family: Three times a week    Attends Religious Services: More than 4 times per year    Active Member of Clubs or Organizations: Yes    Attends Banker Meetings: More than 4 times per year    Marital Status: Married    Outpatient Medications Prior to Visit  Medication Sig Dispense Refill   Ascorbic Acid (VITAMIN C) 100 MG tablet Take 100 mg by mouth daily.     Cetirizine HCl 10 MG CAPS Take 10 mg by mouth as needed. Reported on 03/23/2015     cholecalciferol (VITAMIN D) 1000 UNITS tablet Take 2,000 Units by mouth daily.     diphenhydrAMINE (BENADRYL) 25 MG tablet Take 25 mg by mouth every 6 (six) hours as needed.     Evolocumab (REPATHA SURECLICK) 140 MG/ML SOAJ INJECT 140 MG INTO THE SKIN EVERY 14 (FOURTEEN) DAYS. 6 mL 3   fluticasone (FLONASE) 50 MCG/ACT nasal spray Place 2 sprays into both nostrils daily.     olmesartan (BENICAR) 40 MG tablet TAKE 1 TABLET BY MOUTH EVERY DAY 30 tablet 0   pantoprazole (PROTONIX) 40 MG tablet TAKE 1 TABLET BY MOUTH EVERY DAY *NEED APPT FOR FURTHER REFILLS* 90 tablet 0   VITAMIN E PO Take by mouth.     No facility-administered medications prior to visit.     EXAM:  There were no vitals taken for this visit.  There is no height or weight on file to calculate BMI. Wt Readings from Last 3 Encounters:  11/15/21 164 lb 9.6 oz (74.7 kg)  11/13/21 162 lb 9.6 oz (73.8 kg)  07/11/21 170 lb 9.6 oz (77.4 kg)    Physical Exam: Vital signs reviewed RUE:AVWU is a well-developed well-nourished alert cooperative    who appearsr stated age in no acute distress.  HEENT: normocephalic atraumatic , Eyes: PERRL EOM's full, conjunctiva clear, Nares: paten,t no deformity discharge or tenderness., Ears: no deformity EAC's clear TMs with normal landmarks. Mouth: clear OP, no lesions, edema.  Moist mucous membranes. Dentition in adequate repair. NECK: supple without masses, thyromegaly or bruits. CHEST/PULM:  Clear to  auscultation and percussion breath sounds equal no wheeze , rales or rhonchi. No chest wall deformities or tenderness. Breast: normal by inspection . No dimpling, discharge, masses, tenderness or discharge . CV: PMI is nondisplaced, S1 S2 no gallops, murmurs, rubs. Peripheral pulses are full without delay.No JVD .  ABDOMEN: Bowel sounds normal nontender  No guard or rebound, no hepato splenomegal no CVA tenderness.  No hernia. Extremtities:  No clubbing cyanosis or edema, no acute joint swelling or redness no focal atrophy NEURO:  Oriented x3, cranial nerves 3-12 appear to be intact, no obvious focal weakness,gait within normal limits no abnormal reflexes or asymmetrical SKIN: No acute rashes normal turgor, color, no bruising or petechiae. PSYCH: Oriented, good eye contact, no obvious depression anxiety, cognition and judgment appear normal. LN: no cervical axillary inguinal adenopathy  Lab Results  Component Value Date   WBC 6.3 04/12/2020   HGB 12.8 04/12/2020   HCT 38.4 04/12/2020   PLT 325.0 04/12/2020   GLUCOSE 100 (H) 04/12/2020   CHOL 152 11/17/2021   TRIG 78 11/17/2021   HDL 67 11/17/2021   LDLDIRECT 149.9 03/03/2013   LDLCALC 70 11/17/2021   ALT 18 04/12/2020   AST 17 04/12/2020   NA 139 04/12/2020   K 4.5 04/12/2020   CL 102 04/12/2020   CREATININE 0.58 04/12/2020   BUN 11 04/12/2020   CO2 29 04/12/2020   TSH 1.78 04/12/2020   HGBA1C 5.6 04/12/2020    BP Readings from Last 3 Encounters:  11/15/21 (!) 144/84  11/13/21 124/84  09/12/21 123/87    Lab results reviewed with patient   ASSESSMENT AND PLAN:  Discussed the following assessment and plan:    ICD-10-CM   1. Visit for preventive health examination  Z00.00     2. Essential hypertension  I10     3. Elevated coronary artery calcium score  R93.1     4. Fasting hyperglycemia  R73.01     5. Medication management  Z79.899      No follow-ups on file.  Patient Care Team: Jenascia Bumpass, Neta Mends, MD as PCP -  General (Internal Medicine) Rollene Rotunda, MD as PCP - Cardiology (Cardiology) Aliene Beams, MD as Consulting Physician (Neurosurgery) Rollene Rotunda, MD as Consulting Physician (Cardiology) Pyrtle, Carie Caddy, MD as Consulting Physician (Gastroenterology) There are no Patient Instructions on file for this visit.  Neta Mends. Farhaan Mabee M.D.

## 2022-12-11 ENCOUNTER — Ambulatory Visit (INDEPENDENT_AMBULATORY_CARE_PROVIDER_SITE_OTHER): Payer: BC Managed Care – PPO | Admitting: Internal Medicine

## 2022-12-11 ENCOUNTER — Encounter: Payer: Self-pay | Admitting: Internal Medicine

## 2022-12-11 VITALS — BP 144/90 | HR 82 | Temp 98.1°F | Ht 62.0 in | Wt 160.0 lb

## 2022-12-11 DIAGNOSIS — R7301 Impaired fasting glucose: Secondary | ICD-10-CM | POA: Diagnosis not present

## 2022-12-11 DIAGNOSIS — I10 Essential (primary) hypertension: Secondary | ICD-10-CM

## 2022-12-11 DIAGNOSIS — Z0000 Encounter for general adult medical examination without abnormal findings: Secondary | ICD-10-CM | POA: Diagnosis not present

## 2022-12-11 DIAGNOSIS — Z79899 Other long term (current) drug therapy: Secondary | ICD-10-CM

## 2022-12-11 DIAGNOSIS — E785 Hyperlipidemia, unspecified: Secondary | ICD-10-CM

## 2022-12-11 DIAGNOSIS — R931 Abnormal findings on diagnostic imaging of heart and coronary circulation: Secondary | ICD-10-CM | POA: Diagnosis not present

## 2022-12-11 DIAGNOSIS — R0989 Other specified symptoms and signs involving the circulatory and respiratory systems: Secondary | ICD-10-CM

## 2022-12-11 MED ORDER — PANTOPRAZOLE SODIUM 40 MG PO TBEC: DELAYED_RELEASE_TABLET | ORAL | 3 refills | Status: DC

## 2022-12-11 NOTE — Patient Instructions (Signed)
Good to see you today . UPdate fasting labs   Plan chemistry cbc tsh  fasting lipid panel and hg A1c  . If all ok then yearly check. Continue attention to lifestyle intervention healthy eating and exercise .  Make sure Bp is at goal average 130/80 and below

## 2022-12-14 ENCOUNTER — Telehealth: Payer: Self-pay

## 2022-12-14 NOTE — Telephone Encounter (Signed)
Orders were fax in yesterday to [facility]. Fax number [phone].   Received a confirmation.   Inform pt this morning. Verbalized understanding.

## 2022-12-14 NOTE — Telephone Encounter (Signed)
-----   Message from Berniece Andreas sent at 12/11/2022  5:12 PM EDT ----- Please fax  lab orders to [facility]  to designated lab . Thanks

## 2022-12-18 LAB — LAB REPORT - SCANNED
A1c: 5.7
EGFR: 97

## 2022-12-21 ENCOUNTER — Other Ambulatory Visit: Payer: Self-pay | Admitting: Cardiology

## 2022-12-21 ENCOUNTER — Encounter: Payer: Self-pay | Admitting: Internal Medicine

## 2023-01-26 ENCOUNTER — Other Ambulatory Visit: Payer: Self-pay | Admitting: Cardiology

## 2023-01-26 ENCOUNTER — Encounter: Payer: Self-pay | Admitting: Cardiology

## 2023-01-29 MED ORDER — OLMESARTAN MEDOXOMIL 40 MG PO TABS: 40.0000 mg | ORAL_TABLET | Freq: Every day | ORAL | 0 refills | Status: DC

## 2023-02-21 NOTE — Progress Notes (Signed)
  Cardiology Office Note:   Date:  02/22/2023  ID:  Jacqueline Beasley, DOB Jun 10, 1960, MRN [id number] PCP: Charlett Apolinar POUR, MD   [facility] Providers Cardiologist:  Lynwood Schilling, MD {  History of Present Illness:   Jacqueline Beasley is a 63 y.o. female who presents for evaluation of chest pain.  At the first appt in 2019 I performed a POET (Plain Old Exercise Treadmill).  She exercised for 11 minutes without any ischemic ST T wave changes.   She had an excellent exercise tolerance.  Of note she did not tolerate statin.  She had a negative POET (Plain Old Exercise Treadmill) in July of 2022.    Since I saw her she has had no new cardiovascular plaints.  She does trail hiking.  She does daily walks. The patient denies any new symptoms such as chest discomfort, neck or arm discomfort. There has been no new shortness of breath, PND or orthopnea. There have been no reported palpitations, presyncope or syncope.   ROS: As stated in the HPI and negative for all other systems.  Studies Reviewed:    EKG:   EKG Interpretation Date/Time:  Friday February 22 2023 10:45:06 EST Ventricular Rate:  80 PR Interval:  152 QRS Duration:  86 QT Interval:  380 QTC Calculation: 438 R Axis:   81  Text Interpretation: Normal sinus rhythm No significant change since last tracing Confirmed by Schilling Lynwood ([id number]) on 02/22/2023 11:25:42 AM   P.o.  Risk Assessment/Calculations:              Physical Exam:   VS:  BP 126/88   Pulse 90   Ht 5' 2 (1.575 m)   Wt 159 lb (72.1 kg)   SpO2 98%   BMI 29.08 kg/m    Wt Readings from Last 3 Encounters:  02/22/23 159 lb (72.1 kg)  12/11/22 160 lb (72.6 kg)  11/15/21 164 lb 9.6 oz (74.7 kg)     GEN: Well nourished, well developed in no acute distress NECK: No JVD; No carotid bruits CARDIAC: RRR, no murmurs, rubs, gallops RESPIRATORY:  Clear to auscultation without rales, wheezing or rhonchi  ABDOMEN: Soft, non-tender, non-distended EXTREMITIES:  No  edema; No deformity   ASSESSMENT AND PLAN:   ELEVATED CORONARY CALCIUM :    Her last calcium  score was in 2018.  I like to repeat this.  No further testing is indicated.  She will continue with risk reduction.  DYSLIPIDEMIA:   LDL was 66 with an HDL of 85.  Depending on her calcium  score might modify her goals of therapy but for now she will remain on the meds as listed.    HTN:  The blood pressure is at target.  No change in therapy.  Follow up with me in two years.   Signed, Lynwood Schilling, MD

## 2023-02-22 ENCOUNTER — Ambulatory Visit: Payer: BC Managed Care – PPO | Attending: Cardiology | Admitting: Cardiology

## 2023-02-22 ENCOUNTER — Encounter: Payer: Self-pay | Admitting: Cardiology

## 2023-02-22 VITALS — BP 126/88 | HR 90 | Ht 62.0 in | Wt 159.0 lb

## 2023-02-22 DIAGNOSIS — E785 Hyperlipidemia, unspecified: Secondary | ICD-10-CM | POA: Diagnosis not present

## 2023-02-22 DIAGNOSIS — I10 Essential (primary) hypertension: Secondary | ICD-10-CM

## 2023-02-22 DIAGNOSIS — R931 Abnormal findings on diagnostic imaging of heart and coronary circulation: Secondary | ICD-10-CM | POA: Diagnosis not present

## 2023-02-22 MED ORDER — OLMESARTAN MEDOXOMIL 40 MG PO TABS: 40.0000 mg | ORAL_TABLET | Freq: Every day | ORAL | 3 refills | Status: DC

## 2023-02-22 MED ORDER — REPATHA SURECLICK 140 MG/ML ~~LOC~~ SOAJ: 140.0000 mg | SUBCUTANEOUS | 3 refills | Status: DC

## 2023-02-22 NOTE — Patient Instructions (Signed)
 Medication Instructions:  No changes.  *If you need a refill on your cardiac medications before your next appointment, please call your pharmacy*  Testing/Procedures: Dr Lavona has ordered a CT coronary calcium  score.   Test locations:  [facility]  [place]  This is $99 out of pocket.   Coronary CalciumScan A coronary calcium  scan is an imaging test used to look for deposits of calcium  and other fatty materials (plaques) in the inner lining of the blood vessels of the heart (coronary arteries). These deposits of calcium  and plaques can partly clog and narrow the coronary arteries without producing any symptoms or warning signs. This puts a person at risk for a heart attack. This test can detect these deposits before symptoms develop. Tell a health care provider about: Any allergies you have. All medicines you are taking, including vitamins, herbs, eye drops, creams, and over-the-counter medicines. Any problems you or family members have had with anesthetic medicines. Any blood disorders you have. Any surgeries you have had. Any medical conditions you have. Whether you are pregnant or may be pregnant. What are the risks? Generally, this is a safe procedure. However, problems may occur, including: Harm to a pregnant woman and her unborn baby. This test involves the use of radiation. Radiation exposure can be dangerous to a pregnant woman and her unborn baby. If you are pregnant, you generally should not have this procedure done. Slight increase in the risk of cancer. This is because of the radiation involved in the test. What happens before the procedure? No preparation is needed for this procedure. What happens during the procedure? You will undress and remove any jewelry around your neck or chest. You will put on a hospital gown. Sticky electrodes will be placed on your chest. The  electrodes will be connected to an electrocardiogram (ECG) machine to record a tracing of the electrical activity of your heart. A CT scanner will take pictures of your heart. During this time, you will be asked to lie still and hold your breath for 2-3 seconds while a picture of your heart is being taken. The procedure may vary among health care providers and hospitals. What happens after the procedure? You can get dressed. You can return to your normal activities. It is up to you to get the results of your test. Ask your health care provider, or the department that is doing the test, when your results will be ready. Summary A coronary calcium  scan is an imaging test used to look for deposits of calcium  and other fatty materials (plaques) in the inner lining of the blood vessels of the heart (coronary arteries). Generally, this is a safe procedure. Tell your health care provider if you are pregnant or may be pregnant. No preparation is needed for this procedure. A CT scanner will take pictures of your heart. You can return to your normal activities after the scan is done. This information is not intended to replace advice given to you by your health care provider. Make sure you discuss any questions you have with your health care provider. Document Released: 07/28/2007 Document Revised: 12/19/2015 Document Reviewed: 12/19/2015 Elsevier Interactive Patient Education  2017 [employer].    Follow-Up: At [facility], you and your health needs are our priority.  As part of our continuing mission to provide you with exceptional heart care, we have created designated Provider Care Teams.  These Care Teams include your primary  Cardiologist (physician) and Advanced Practice Providers (APPs -  Physician Assistants and Nurse Practitioners) who all work together to provide you with the care you need, when you need it.  We recommend signing up for the patient portal called MyChart.  Sign up  information is provided on this After Visit Summary.  MyChart is used to connect with patients for Virtual Visits (Telemedicine).  Patients are able to view lab/test results, encounter notes, upcoming appointments, etc.  Non-urgent messages can be sent to your provider as well.   To learn more about what you can do with MyChart, go to [email or web address].    Your next appointment:   2 year(s)  Provider:   Lynwood Schilling, MD

## 2023-03-06 ENCOUNTER — Encounter: Payer: Self-pay | Admitting: Cardiology

## 2023-03-08 ENCOUNTER — Telehealth: Payer: Self-pay | Admitting: Pharmacy Technician

## 2023-03-08 ENCOUNTER — Other Ambulatory Visit (HOSPITAL_COMMUNITY): Payer: Self-pay

## 2023-03-08 NOTE — Telephone Encounter (Signed)
Pharmacy Patient Advocate Encounter   Received notification from Patient Advice Request messages that prior authorization for repatha is required/requested.   Insurance verification completed.   The patient is insured through  [employer]  .   Per test claim: PA required; PA submitted to above mentioned insurance via CoverMyMeds Key/confirmation #/EOC [id number] Status is pending

## 2023-03-08 NOTE — Telephone Encounter (Signed)
Pharmacy Patient Advocate Encounter  Received notification from  [employer]  that Prior Authorization for repatha has been DENIED.  See denial reason below. No denial letter attached in CMM. Will attach denial letter to Media tab once received.   PA #/Case ID/Reference #: [id number]

## 2023-03-19 ENCOUNTER — Telehealth: Payer: Self-pay | Admitting: Pharmacy Technician

## 2023-03-19 ENCOUNTER — Other Ambulatory Visit (HOSPITAL_COMMUNITY): Payer: Self-pay

## 2023-03-19 ENCOUNTER — Telehealth: Payer: Self-pay | Admitting: Cardiology

## 2023-03-19 NOTE — Telephone Encounter (Signed)
 Pharmacy Patient Advocate Encounter   Received notification from Physician's Office that prior authorization for praluent  is required/requested.   Insurance verification completed.   The patient is insured through [employer] .   Per test claim: PA required; PA submitted to above mentioned insurance via CoverMyMeds Key/confirmation #/EOC [id number] Status is pending

## 2023-03-19 NOTE — Telephone Encounter (Signed)
 Pt c/o medication issue:  1. Name of Medication: praluent    2. How are you currently taking this medication (dosage and times per day)? As Written  3. Are you having a reaction (difficulty breathing--STAT)? No   4. What is your medication issue? The pharmacy is calling to inform us  the PA was denied. The pharmacy stated the PA was denied because we selected that the patient has achieved the lipid goals. Please advise.

## 2023-03-20 NOTE — Telephone Encounter (Signed)
 Called plan to reconsider-pending

## 2023-03-27 ENCOUNTER — Other Ambulatory Visit (HOSPITAL_BASED_OUTPATIENT_CLINIC_OR_DEPARTMENT_OTHER): Payer: BC Managed Care – PPO

## 2023-03-29 ENCOUNTER — Other Ambulatory Visit (HOSPITAL_COMMUNITY): Payer: Self-pay

## 2023-04-01 ENCOUNTER — Other Ambulatory Visit (HOSPITAL_COMMUNITY): Payer: Self-pay

## 2023-04-01 NOTE — Telephone Encounter (Signed)
 Pending

## 2023-04-02 ENCOUNTER — Other Ambulatory Visit (HOSPITAL_COMMUNITY): Payer: Self-pay

## 2023-04-03 ENCOUNTER — Other Ambulatory Visit (HOSPITAL_COMMUNITY): Payer: BC Managed Care – PPO

## 2023-04-04 ENCOUNTER — Other Ambulatory Visit (HOSPITAL_COMMUNITY): Payer: Self-pay

## 2023-04-04 MED ORDER — PRALUENT 150 MG/ML ~~LOC~~ SOAJ: 150.0000 mg | SUBCUTANEOUS | 2 refills | Status: DC

## 2023-04-04 NOTE — Addendum Note (Signed)
 Addended by: Tylene Fantasia on: 04/04/2023 04:27 PM   Modules accepted: Orders

## 2023-04-04 NOTE — Telephone Encounter (Signed)
 Pharmacy Patient Advocate Encounter  Received notification from [facility] that Prior Authorization for Praluent has been APPROVED from 04/04/23 to 10/01/23. Ran test claim, Copay is $121.19- one month. This test claim was processed through [facility]- copay amounts may vary at other pharmacies due to pharmacy/plan contracts, or as the patient moves through the different stages of their insurance plan.   PA #/Case ID/Reference #: [id number]

## 2023-04-22 ENCOUNTER — Ambulatory Visit (HOSPITAL_COMMUNITY)
Admission: RE | Admit: 2023-04-22 | Discharge: 2023-04-22 | Disposition: A | Discharge status: Home / Self Care | Payer: Self-pay | Source: Ambulatory Visit | Attending: Cardiology | Admitting: Cardiology

## 2023-04-22 DIAGNOSIS — R931 Abnormal findings on diagnostic imaging of heart and coronary circulation: Secondary | ICD-10-CM | POA: Insufficient documentation

## 2023-04-29 ENCOUNTER — Encounter: Payer: Self-pay | Admitting: *Deleted

## 2023-04-29 DIAGNOSIS — R931 Abnormal findings on diagnostic imaging of heart and coronary circulation: Secondary | ICD-10-CM

## 2023-04-29 DIAGNOSIS — E785 Hyperlipidemia, unspecified: Secondary | ICD-10-CM

## 2023-05-09 LAB — NMR, LIPOPROFILE
Cholesterol, Total: 164 mg/dL (ref 100–199)
HDL Particle Number: 48.4 umol/L (ref >=30.5)
HDL-C: 72 mg/dL (ref >39)
LDL Particle Number: 671 nmol/L (ref <1000)
LDL Size: 21.3 nm (ref >20.5)
LDL-C (NIH Calc): 75 mg/dL (ref 0–99)
LP-IR Score: 44 (ref <=45)
Small LDL Particle Number: 307 nmol/L (ref <=527)
Triglycerides: 91 mg/dL (ref 0–149)

## 2023-08-01 ENCOUNTER — Other Ambulatory Visit: Payer: Self-pay | Admitting: Cardiology

## 2023-08-13 ENCOUNTER — Telehealth: Payer: Self-pay | Admitting: *Deleted

## 2023-08-13 ENCOUNTER — Other Ambulatory Visit: Payer: Self-pay | Admitting: Internal Medicine

## 2023-08-13 ENCOUNTER — Encounter: Payer: Self-pay | Admitting: Cardiology

## 2023-08-13 DIAGNOSIS — Z1231 Encounter for screening mammogram for malignant neoplasm of breast: Secondary | ICD-10-CM

## 2023-08-13 NOTE — Telephone Encounter (Signed)
 Follow up with pt. Pt reports she is having a procedure done on 9/18 and needs a surgical clearance from pcp and cardiologist. Offer pt an appointment. Appt scheduled on 10/15/2023. Pt states she will need EKG and labs.   Pt states she has the form. Ask pt if she can fax the form over to us . Pt said she will fax it tomorrow. Sent our office fax number to pt via mychart message.   Forwarding to provider.

## 2023-08-13 NOTE — Telephone Encounter (Signed)
  Communication  Reason for CRM: Needs an EKG for a procedure in September. Pt needs orders for this now, from Dr. Lavona        Best contact:[phone]

## 2023-08-15 ENCOUNTER — Encounter: Payer: Self-pay | Admitting: Cardiology

## 2023-08-15 NOTE — Telephone Encounter (Signed)
 Spoke to patient , informed patient she would need an appointment for EKG as well seeing Dr Lavona.  Patient states she thought she need an appointment. She was in agreement.  She states she is not available until 10/13/23.  Appointment schedule  for 10/16/23 at 8:40 am  Patient is aware to bring clearance form with her to the appointment

## 2023-09-06 ENCOUNTER — Ambulatory Visit
Admission: RE | Admit: 2023-09-06 | Discharge: 2023-09-06 | Disposition: A | Discharge status: Home / Self Care | Source: Ambulatory Visit | Attending: Internal Medicine | Admitting: Internal Medicine

## 2023-09-06 DIAGNOSIS — Z1231 Encounter for screening mammogram for malignant neoplasm of breast: Secondary | ICD-10-CM

## 2023-10-13 NOTE — Progress Notes (Unsigned)
 Cardiology Office Note:   Date:  10/16/2023  ID:  Jacqueline Beasley, DOB 03/22/1960, MRN [id number] PCP: Charlett Apolinar POUR, MD  [facility] Providers Cardiologist:  Lynwood Schilling, MD {  History of Present Illness:   Jacqueline Beasley is a 63 y.o. female  who presents for evaluation of chest pain.  At the first appt in 2019 I performed a POET (Plain Old Exercise Treadmill).  She exercised for 11 minutes without any ischemic ST T wave changes.   She had an excellent exercise tolerance.  Of note she did not tolerate statin.  She had a negative POET (Plain Old Exercise Treadmill) in July of 2022.    Since I saw her she has done well.  She is going to have explant of breast implants and she needs preop clearance.  She remains active.  The patient denies any new symptoms such as chest discomfort, neck or arm discomfort. There has been no new shortness of breath, PND or orthopnea. There have been no reported palpitations, presyncope or syncope.  She does quite a bit of hiking.  With this level of activity she has no cardiovascular symptoms.  Of note her however calcium  score was done again earlier this year.  She did increase from 45 in 2018 to 122.  She has no new cardiovascular symptoms since her negative stress test above.  ROS: As stated in the HPI and negative for all other systems.  Studies Reviewed:    EKG:   EKG Interpretation Date/Time:  Wednesday October 16 2023 08:39:30 EDT Ventricular Rate:  89 PR Interval:  156 QRS Duration:  90 QT Interval:  354 QTC Calculation: 430 R Axis:   47  Text Interpretation: Normal sinus rhythm  Poor anterior R wave progression When compared with ECG of 22-Feb-2023 10:45, No significant change since last tracing Confirmed by Schilling Lynwood ([id number]) on 10/16/2023 8:44:59 AM    Risk Assessment/Calculations:         Physical Exam:   VS:  BP 110/78 (BP Location: Right Arm, Patient Position: Sitting, Cuff Size: Normal)   Pulse 89   Ht 5' 2 (1.575 m)    Wt 157 lb 3.2 oz (71.3 kg)   SpO2 99%   BMI 28.75 kg/m    Wt Readings from Last 3 Encounters:  10/16/23 157 lb 3.2 oz (71.3 kg)  10/15/23 156 lb 9.6 oz (71 kg)  02/22/23 159 lb (72.1 kg)     GEN: Well nourished, well developed in no acute distress NECK: No JVD; No carotid bruits CARDIAC: RRR, no murmurs, rubs, gallops RESPIRATORY:  Clear to auscultation without rales, wheezing or rhonchi  ABDOMEN: Soft, non-tender, non-distended EXTREMITIES:  No edema; No deformity   ASSESSMENT AND PLAN:   ELEVATED CORONARY CALCIUM :    We are going to pursue aggressive risk reduction as below.   DYSLIPIDEMIA:   LDL was 75 with an HDL of 72.  I like to add Zetia  10 mg daily to get an LDL at least in the 50s.  In 3 months should get an NMR profile.    HTN:  The blood pressure is at target.  No change in therapy.   PREOP: The patient has no unstable findings or symptoms.  He has a very high functional level.  She is not going for high risk cardiovascular testing.  Therefore, according to ACC/AHA guidelines the patient is at acceptable risk for the planned surgery without further cardiovascular testing.     Follow up as needed.  Signed, Lynwood Schilling, MD

## 2023-10-15 ENCOUNTER — Ambulatory Visit: Admitting: Internal Medicine

## 2023-10-15 ENCOUNTER — Ambulatory Visit: Payer: Self-pay | Admitting: Internal Medicine

## 2023-10-15 ENCOUNTER — Encounter: Payer: Self-pay | Admitting: Internal Medicine

## 2023-10-15 VITALS — BP 114/80 | HR 88 | Temp 98.0°F | Ht 62.0 in | Wt 156.6 lb

## 2023-10-15 DIAGNOSIS — Z79899 Other long term (current) drug therapy: Secondary | ICD-10-CM | POA: Diagnosis not present

## 2023-10-15 DIAGNOSIS — Z01818 Encounter for other preprocedural examination: Secondary | ICD-10-CM | POA: Diagnosis not present

## 2023-10-15 DIAGNOSIS — J3089 Other allergic rhinitis: Secondary | ICD-10-CM

## 2023-10-15 DIAGNOSIS — R931 Abnormal findings on diagnostic imaging of heart and coronary circulation: Secondary | ICD-10-CM

## 2023-10-15 DIAGNOSIS — Z9882 Breast implant status: Secondary | ICD-10-CM | POA: Diagnosis not present

## 2023-10-15 LAB — URINALYSIS, ROUTINE W REFLEX MICROSCOPIC
Bilirubin Urine: NEGATIVE
Hgb urine dipstick: NEGATIVE
Ketones, ur: NEGATIVE
Leukocytes,Ua: NEGATIVE
Nitrite: NEGATIVE
Specific Gravity, Urine: 1.01 (ref 1.000–1.030)
Total Protein, Urine: NEGATIVE
Urine Glucose: NEGATIVE
Urobilinogen, UA: 0.2 (ref 0.0–1.0)
pH: 7 (ref 5.0–8.0)

## 2023-10-15 LAB — CBC WITH DIFFERENTIAL/PLATELET
Basophils Absolute: 0 K/uL (ref 0.0–0.1)
Basophils Relative: 1.1 % (ref 0.0–3.0)
Eosinophils Absolute: 0.1 K/uL (ref 0.0–0.7)
Eosinophils Relative: 1.9 % (ref 0.0–5.0)
HCT: 41 % (ref 36.0–46.0)
Hemoglobin: 13.6 g/dL (ref 12.0–15.0)
Lymphocytes Relative: 26.9 % (ref 12.0–46.0)
Lymphs Abs: 0.9 K/uL (ref 0.7–4.0)
MCHC: 33.2 g/dL (ref 30.0–36.0)
MCV: 90 fl (ref 78.0–100.0)
Monocytes Absolute: 0.3 K/uL (ref 0.1–1.0)
Monocytes Relative: 9.8 % (ref 3.0–12.0)
Neutro Abs: 2.1 K/uL (ref 1.4–7.7)
Neutrophils Relative %: 60.3 % (ref 43.0–77.0)
Platelets: 315 K/uL (ref 150.0–400.0)
RBC: 4.56 Mil/uL (ref 3.87–5.11)
RDW: 13.1 % (ref 11.5–15.5)
WBC: 3.5 K/uL — ABNORMAL LOW (ref 4.0–10.5)

## 2023-10-15 LAB — COMPREHENSIVE METABOLIC PANEL WITH GFR
ALT: 31 U/L (ref 0–35)
AST: 28 U/L (ref 0–37)
Albumin: 4.7 g/dL (ref 3.5–5.2)
Alkaline Phosphatase: 73 U/L (ref 39–117)
BUN: 10 mg/dL (ref 6–23)
CO2: 28 meq/L (ref 19–32)
Calcium: 9.5 mg/dL (ref 8.4–10.5)
Chloride: 101 meq/L (ref 96–112)
Creatinine, Ser: 0.67 mg/dL (ref 0.40–1.20)
GFR: 94.36 mL/min (ref >60.00)
Glucose, Bld: 94 mg/dL (ref 70–99)
Potassium: 4.3 meq/L (ref 3.5–5.1)
Sodium: 140 meq/L (ref 135–145)
Total Bilirubin: 0.4 mg/dL (ref 0.2–1.2)
Total Protein: 7.4 g/dL (ref 6.0–8.3)

## 2023-10-15 LAB — PROTIME-INR
INR: 1 ratio (ref 0.8–1.0)
Prothrombin Time: 10.3 s (ref 9.6–13.1)

## 2023-10-15 LAB — APTT: aPTT: 29.9 s (ref 25.4–36.8)

## 2023-10-15 NOTE — Progress Notes (Signed)
 Chief Complaint  Patient presents with   Pre-op Exam   Cough    Pt c/o cough, facial pressure and headache. Going on for couple of wks. Blow out yellow mucus.     HPI: Patient  Jacqueline Beasley  63 y.o. comes in today for preop evaluation for breast implant removal to be done Sept 18 2025  . Needs clearance form and labs as  requested.  Dr.  Dewitt Organ [place].   ? If having a sinus infection.  Had ongoing allergy  sx upper   more recently  zyrtec no fever pain but full feeling  some nasal drainage but no fever . Some yellow gets a cough with allergy  no sob wheezing .   No bleeding cp sob syncope neuro sx   Resp as above . On meds for high ct calcium  score but no sx at this time of cv distress.  Health Maintenance  Topic Date Due   Zoster Vaccines- Shingrix (1 of 2) Never done   Pneumococcal Vaccine: 50+ Years (1 of 1 - PCV) Never done   INFLUENZA VACCINE  Never done   COVID-19 Vaccine (5 - 2025-26 season) 10/14/2023   DTaP/Tdap/Td (4 - Td or Tdap) 03/22/2025   MAMMOGRAM  09/05/2025   Colonoscopy  11/30/2028   Hepatitis C Screening  Completed   HIV Screening  Completed   Hepatitis B Vaccines 19-59 Average Risk  Aged Out   HPV VACCINES  Aged Out   Meningococcal B Vaccine  Aged Out     ROS:  GEN/ HEENT: No fever, significant weight changes sweats headaches vision problems hearing changes, CV/ PULM; No chest pain shortness of breath cough, syncope,edema  change in exercise tolerance. GI /GU: No adominal pain, vomiting, change in bowel habits. No blood in the stool. No significant GU symptoms. Ocass shooting pain suprapubic area no other uti sx  SKIN/HEME: ,no acute skin rashes suspicious lesions or bleeding. No lymphadenopathy, nodules, masses.  NEURO/ PSYCH:  No neurologic signs such as weakness numbness. No depression anxiety. IMM/ Allergy : No unusual infections.  Allergy  .   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    AUGMENTATION MAMMAPLASTY     BREAST ENHANCEMENT SURGERY  2010   CESAREAN SECTION  [id number],8007    Family History  Problem Relation Age of Onset   Cancer Mother        Uterus   Stroke Mother        died 44    Hypertension Mother    Hypertension Sister    Diabetes Maternal Grandmother    Colon cancer Neg Hx    Breast cancer Neg Hx     Social History   Socioeconomic History   Marital status: Married    Spouse name: Not on file   Number of children: 2   Years of education: Not on file   Highest education level: Some college, no degree  Occupational History   Not on file  Tobacco Use  Smoking status: Never   Smokeless tobacco: Never  Vaping Use   Vaping status: Never Used  Substance and Sexual Activity   Alcohol use: Yes    Alcohol/week: 7.0 standard drinks of alcohol    Types: 7 Glasses of wine per week    Comment: 1 glass of wine a night   Drug use: No   Sexual activity: Yes  Other Topics Concern   Not on file  Social History Narrative   Lives at home with husband.   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 [place]  2 years +college    G3 P2   Neg ets    Social Drivers of Health   Financial Resource Strain: Low Risk  (10/14/2023)   Overall Financial Resource Strain (CARDIA)    Difficulty of Paying Living Expenses: Not hard at all  Food Insecurity: No Food Insecurity (10/14/2023)   Hunger Vital Sign    Worried About Running Out of Food in the Last Year: Never true    Ran Out of Food in the Last Year: Never true  Transportation Needs: No Transportation Needs (10/14/2023)   PRAPARE - Therapist, art (Medical): No    Lack of Transportation (Non-Medical): No  Physical Activity: Insufficiently Active (10/14/2023)   Exercise Vital Sign    Days of Exercise per Week: 4 days    Minutes of Exercise per Session: 20 min  Stress: No Stress Concern Present (12/10/2022)   [employer] of Occupational Health - Occupational Stress Questionnaire    Feeling of Stress : Only a little  Social Connections: Socially Integrated (10/14/2023)   Social Connection and Isolation Panel    Frequency of Communication with Friends and Family: More than three times a week    Frequency of Social Gatherings with Friends and Family: Once a week    Attends Religious Services: More than 4 times per year    Active Member of [employer] or Organizations: Yes    Attends Engineer, structural: More than 4 times per year    Marital Status: Married    Outpatient Medications Prior to Visit  Medication Sig Dispense Refill   Alirocumab  (PRALUENT ) 150 MG/ML SOAJ INJECT 1 ML (150 MG TOTAL) INTO THE SKIN EVERY 14 (FOURTEEN) DAYS. 6 mL 3   Cetirizine HCl 10 MG CAPS Take 10 mg by mouth as needed. Reported on 03/23/2015     diphenhydrAMINE (BENADRYL) 25 MG tablet Take 25 mg by mouth every 6 (six) hours as needed.     fluticasone (FLONASE) 50 MCG/ACT nasal spray Place 2 sprays into both nostrils daily. (Patient taking differently: Place 2 sprays into both nostrils daily. As needed)     Multiple Vitamins-Minerals (MULTIVITAMIN GUMMIES ADULT PO) Take by mouth daily.     olmesartan  (BENICAR ) 40 MG tablet Take 1 tablet (40 mg total) by mouth daily. 90 tablet 3   pantoprazole  (PROTONIX ) 40 MG tablet TAKE 1 TABLET BY MOUTH EVERY DAY 90 tablet 3   TIRZEPATIDE  Inject 12.5 mg as directed 2 (two) times a week. Twice a week.     Ascorbic Acid (VITAMIN C) 100 MG tablet Take 100 mg by mouth daily. (Patient not taking: Reported on 10/15/2023)     cholecalciferol (VITAMIN D) 1000 UNITS tablet Take 2,000 Units by mouth  daily. (Patient not taking: Reported on 10/15/2023)     VITAMIN E PO Take by mouth. (Patient not taking: Reported on 10/15/2023)     No facility-administered  medications prior to visit.     EXAM:  BP 114/80 (BP Location: Left Arm, Patient Position: Sitting, Cuff Size: Large)   Pulse 88   Temp 98 F (36.7 C) (Oral)   Ht 5' 2 (1.575 m)   Wt 156 lb 9.6 oz (71 kg)   SpO2 98%   BMI 28.64 kg/m   Body mass index is 28.64 kg/m. Wt Readings from Last 3 Encounters:  10/15/23 156 lb 9.6 oz (71 kg)  02/22/23 159 lb (72.1 kg)  12/11/22 160 lb (72.6 kg)    Physical Exam: Vital signs reviewed HZW:Uypd is a well-developed well-nourished alert cooperative    who appearsr stated age in no acute distress.  HEENT: normocephalic atraumatic , Eyes: PERRL EOM's full, conjunctiva clear, Nares: paten,t no deformity discharge or tenderness. Mild congestion no edema , Ears: no deformity EAC's clear TMs with normal landmarks. Mouth: clear OP, no lesions, edema.  Moist mucous membranes. Dentition in adequate repair. NECK: supple without masses, thyromegaly or bruits. CHEST/PULM:  Clear to auscultation and percussion breath sounds equal no wheeze , rales or rhonchi. CV: PMI is nondisplaced, S1 S2 no gallops, murmurs, rubs. Peripheral pulses are full without delay.No JVD .  ABDOMEN: Bowel sounds normal nontender  No guard or rebound, no hepato splenomegal no CVA tenderness.   Extremtities:  No clubbing cyanosis or edema, no acute joint swelling or redness no focal atrophy NEURO:  Oriented x3, cranial nerves 3-12 appear to be intact, no obvious focal weakness,gait within normal limits no abnormal reflexes or asymmetrical SKIN: No acute rashes normal turgor, color, no bruising or petechiae. PSYCH: Oriented, good eye contact, no obvious depression anxiety, cognition and judgment appear normal. LN: no cervical axillary  adenopathy  Lab Results  Component Value Date   WBC 3.5 (L) 10/15/2023   HGB 13.6  10/15/2023   HCT 41.0 10/15/2023   PLT 315.0 10/15/2023   GLUCOSE 94 10/15/2023   CHOL 152 11/17/2021   TRIG 78 11/17/2021   HDL 67 11/17/2021   LDLDIRECT 149.9 03/03/2013   LDLCALC 70 11/17/2021   ALT 31 10/15/2023   AST 28 10/15/2023   NA 140 10/15/2023   K 4.3 10/15/2023   CL 101 10/15/2023   CREATININE 0.67 10/15/2023   BUN 10 10/15/2023   CO2 28 10/15/2023   TSH 1.78 04/12/2020   INR 1.0 10/15/2023   HGBA1C 5.6 04/12/2020    BP Readings from Last 3 Encounters:  10/15/23 114/80  02/22/23 126/88  12/11/22 (!) 144/90    Lab plan ordered as per pre op reviewed with patient   ASSESSMENT AND PLAN:  Discussed the following assessment and plan:    ICD-10-CM   1. Pre-op evaluation  Z01.818 CBC with Differential/Platelet    Comprehensive metabolic panel with GFR    Protime-INR    APTT    Urinalysis, Routine w reflex microscopic    2. H/O bilateral breast implants  Z98.82 CBC with Differential/Platelet    Comprehensive metabolic panel with GFR    Protime-INR    APTT    Urinalysis, Routine w reflex microscopic    3. Medication management  Z79.899 CBC with Differential/Platelet    Comprehensive metabolic panel with GFR    Protime-INR    APTT    Urinalysis, Routine w reflex microscopic    4. Environmental and seasonal allergies  J30.89 CBC with Differential/Platelet    Comprehensive metabolic panel with GFR    Protime-INR    APTT    Urinalysis, Routine w reflex microscopic  5. Elevated coronary artery calcium  score  R93.1      Optimized medically  for surgery as planned. However if  persistent or progressive sinus issues we may add antibiotic but I think adding incs should be enough at this time   Add INCS  sinus hygiene and if progressive consider adding empiric antibiotic   . Pre op labs   plus ua   To see cardiology tomorrow    has high ct calcium  score and no obv sx  but on praluent .  (Suspect the fleeting  pain suprapubic could be  abd wall and  related to previous   c section etc and positioning with cello playing .SABRASABRASABRASABRA) No follow-ups on file.  Patient Care Team: Somaly Marteney, Apolinar POUR, MD as PCP - General (Internal Medicine) Lavona Agent, MD as PCP - Cardiology (Cardiology) Carles Gear, MD as Consulting Physician (Neurosurgery) Lavona Agent, MD as Consulting Physician (Cardiology) Pyrtle, Gordy HERO, MD as Consulting Physician (Gastroenterology) Patient Instructions  Good to see you today  Lab today and will get notes and results faxed to your surgery team.  Add flonase   etc to your allergy  management  If localized pain etc we can rx for complicated sinusitis  but  don't think  indicated today .   Jordon Bourquin K. Zinnia Tindall M.D.

## 2023-10-15 NOTE — Progress Notes (Signed)
 Results in range or  clinically insignificant out of range . Urine clear without UTI Please fax results to dr Dewitt  as directed with copy of note encounter.  [phone]

## 2023-10-15 NOTE — Patient Instructions (Addendum)
 Good to see you today  Lab today and will get notes and results faxed to your surgery team.  Add flonase   etc to your allergy  management  If localized pain etc we can rx for complicated sinusitis  but  don't think  indicated today .

## 2023-10-16 ENCOUNTER — Telehealth: Payer: Self-pay | Admitting: Pharmacy Technician

## 2023-10-16 ENCOUNTER — Encounter: Payer: Self-pay | Admitting: Cardiology

## 2023-10-16 ENCOUNTER — Other Ambulatory Visit (HOSPITAL_COMMUNITY): Payer: Self-pay

## 2023-10-16 ENCOUNTER — Ambulatory Visit: Attending: Cardiology | Admitting: Cardiology

## 2023-10-16 VITALS — BP 110/78 | HR 89 | Ht 62.0 in | Wt 157.2 lb

## 2023-10-16 DIAGNOSIS — I10 Essential (primary) hypertension: Secondary | ICD-10-CM

## 2023-10-16 DIAGNOSIS — E785 Hyperlipidemia, unspecified: Secondary | ICD-10-CM | POA: Diagnosis not present

## 2023-10-16 DIAGNOSIS — R931 Abnormal findings on diagnostic imaging of heart and coronary circulation: Secondary | ICD-10-CM

## 2023-10-16 MED ORDER — EZETIMIBE 10 MG PO TABS: 10.0000 mg | ORAL_TABLET | Freq: Every day | ORAL | 3 refills | Status: AC

## 2023-10-16 NOTE — Patient Instructions (Addendum)
 Medication Instructions:  Start Zetia  10 mg once daily *If you need a refill on your cardiac medications before your next appointment, please call your pharmacy*  Lab Work: NMR in 3 months at [facility] If you have labs (blood work) drawn today and your tests are completely normal, you will receive your results only by: MyChart Message (if you have MyChart) OR A paper copy in the mail If you have any lab test that is abnormal or we need to change your treatment, we will call you to review the results.  Testing/Procedures: NONE  Follow-Up: At [facility], you and your health needs are our priority.  As part of our continuing mission to provide you with exceptional heart care, our providers are all part of one team.  This team includes your primary Cardiologist (physician) and Advanced Practice Providers or APPs (Physician Assistants and Nurse Practitioners) who all work together to provide you with the care you need, when you need it.  Your next appointment:   As needed  Provider:   Lavona, MD   We recommend signing up for the patient portal called MyChart.  Sign up information is provided on this After Visit Summary.  MyChart is used to connect with patients for Virtual Visits (Telemedicine).  Patients are able to view lab/test results, encounter notes, upcoming appointments, etc.  Non-urgent messages can be sent to your provider as well.   To learn more about what you can do with MyChart, go to [email or web address].   Other Instructions Note provided for pre op purposes

## 2023-10-16 NOTE — Telephone Encounter (Signed)
 Pharmacy Patient Advocate Encounter  Received notification from [facility] that Prior Authorization for ezetimibe  has been APPROVED from 10/16/23 to 10/15/24   PA #/Case ID/Reference #: [id number]

## 2023-10-16 NOTE — Telephone Encounter (Signed)
   Pharmacy Patient Advocate Encounter   Received notification from CoverMyMeds that prior authorization for EZETIMIBE  10MG  is required/requested.   Insurance verification completed.   The patient is insured through [employer] .   Per test claim: PA required; PA submitted to above mentioned insurance via Latent Key/confirmation #/EOC [id number] Status is pending

## 2023-10-18 ENCOUNTER — Telehealth: Payer: Self-pay | Admitting: Cardiology

## 2023-10-18 NOTE — Telephone Encounter (Signed)
 Office calling to request most recent office visit notes as well as EKG results for upcoming procedure. Please advise

## 2023-10-18 NOTE — Telephone Encounter (Signed)
 Notes have been faxed

## 2023-11-20 ENCOUNTER — Other Ambulatory Visit: Payer: Self-pay | Admitting: Internal Medicine

## 2023-12-10 ENCOUNTER — Other Ambulatory Visit: Payer: Self-pay | Admitting: Cardiology

## 2023-12-12 ENCOUNTER — Telehealth: Payer: Self-pay | Admitting: Pharmacy Technician

## 2023-12-12 ENCOUNTER — Other Ambulatory Visit (HOSPITAL_COMMUNITY): Payer: Self-pay

## 2023-12-12 NOTE — Telephone Encounter (Signed)
  Praluent  150mg   Pharmacy Patient Advocate Encounter   Received notification from Pt Calls Messages that prior authorization for praluent  is required/requested.   Insurance verification completed.   The patient is insured through [employer].   Per test claim: PA required; PA submitted to above mentioned insurance via Latent Key/confirmation #/EOC [facility] Status is pending   Pharmacy Patient Advocate Encounter  Received notification from [employer] that Prior Authorization for praluent  has been APPROVED from 12/12/23 to 12/11/24   PA #/Case ID/Reference #: [id number]

## 2024-02-19 ENCOUNTER — Other Ambulatory Visit: Payer: Self-pay | Admitting: Cardiology
